# Patient Record
Sex: Female | Born: 1955 | Race: Black or African American | Hispanic: No | Marital: Single | Smoking: Never smoker
Health system: Southern US, Community
[De-identification: ages and names within clinical notes are randomized; demographics above are authoritative.]

## PROBLEM LIST (undated history)

## (undated) DIAGNOSIS — F419 Anxiety disorder, unspecified: Secondary | ICD-10-CM

## (undated) DIAGNOSIS — R87629 Unspecified abnormal cytological findings in specimens from vagina: Secondary | ICD-10-CM

## (undated) DIAGNOSIS — G43909 Migraine, unspecified, not intractable, without status migrainosus: Secondary | ICD-10-CM

## (undated) DIAGNOSIS — M1990 Unspecified osteoarthritis, unspecified site: Secondary | ICD-10-CM

## (undated) DIAGNOSIS — K2970 Gastritis, unspecified, without bleeding: Secondary | ICD-10-CM

## (undated) DIAGNOSIS — F32A Depression, unspecified: Secondary | ICD-10-CM

## (undated) DIAGNOSIS — C801 Malignant (primary) neoplasm, unspecified: Secondary | ICD-10-CM

## (undated) DIAGNOSIS — I10 Essential (primary) hypertension: Secondary | ICD-10-CM

## (undated) DIAGNOSIS — N289 Disorder of kidney and ureter, unspecified: Secondary | ICD-10-CM

## (undated) HISTORY — DX: Unspecified osteoarthritis, unspecified site: M19.90

## (undated) HISTORY — PX: CHOLECYSTECTOMY: SHX55

## (undated) HISTORY — DX: Essential (primary) hypertension: I10

## (undated) HISTORY — DX: Gastritis, unspecified, without bleeding: K29.70

## (undated) HISTORY — PX: TUBAL LIGATION: SHX77

## (undated) HISTORY — PX: NEPHRECTOMY: SHX65

## (undated) HISTORY — DX: Malignant (primary) neoplasm, unspecified: C80.1

## (undated) HISTORY — DX: Anxiety disorder, unspecified: F41.9

## (undated) HISTORY — DX: Unspecified abnormal cytological findings in specimens from vagina: R87.629

## (undated) HISTORY — DX: Depression, unspecified: F32.A

---

## 2008-01-01 ENCOUNTER — Emergency Department (HOSPITAL_COMMUNITY): Admission: EM | Admit: 2008-01-01 | Discharge: 2008-01-01 | Payer: Self-pay | Admitting: Emergency Medicine

## 2008-01-29 ENCOUNTER — Emergency Department (HOSPITAL_COMMUNITY): Admission: EM | Admit: 2008-01-29 | Discharge: 2008-01-29 | Payer: Self-pay | Admitting: Emergency Medicine

## 2008-07-18 IMAGING — CR DG CHEST 2V
2 series · 2 of 2 positions shown · non-contrast
Comparison: No priors for comparison.

CLINICAL DATA: Right-sided chest pain. Shortness of breath.  Dizziness. 
 CHEST ? 2 VIEW:

[w chest pa]
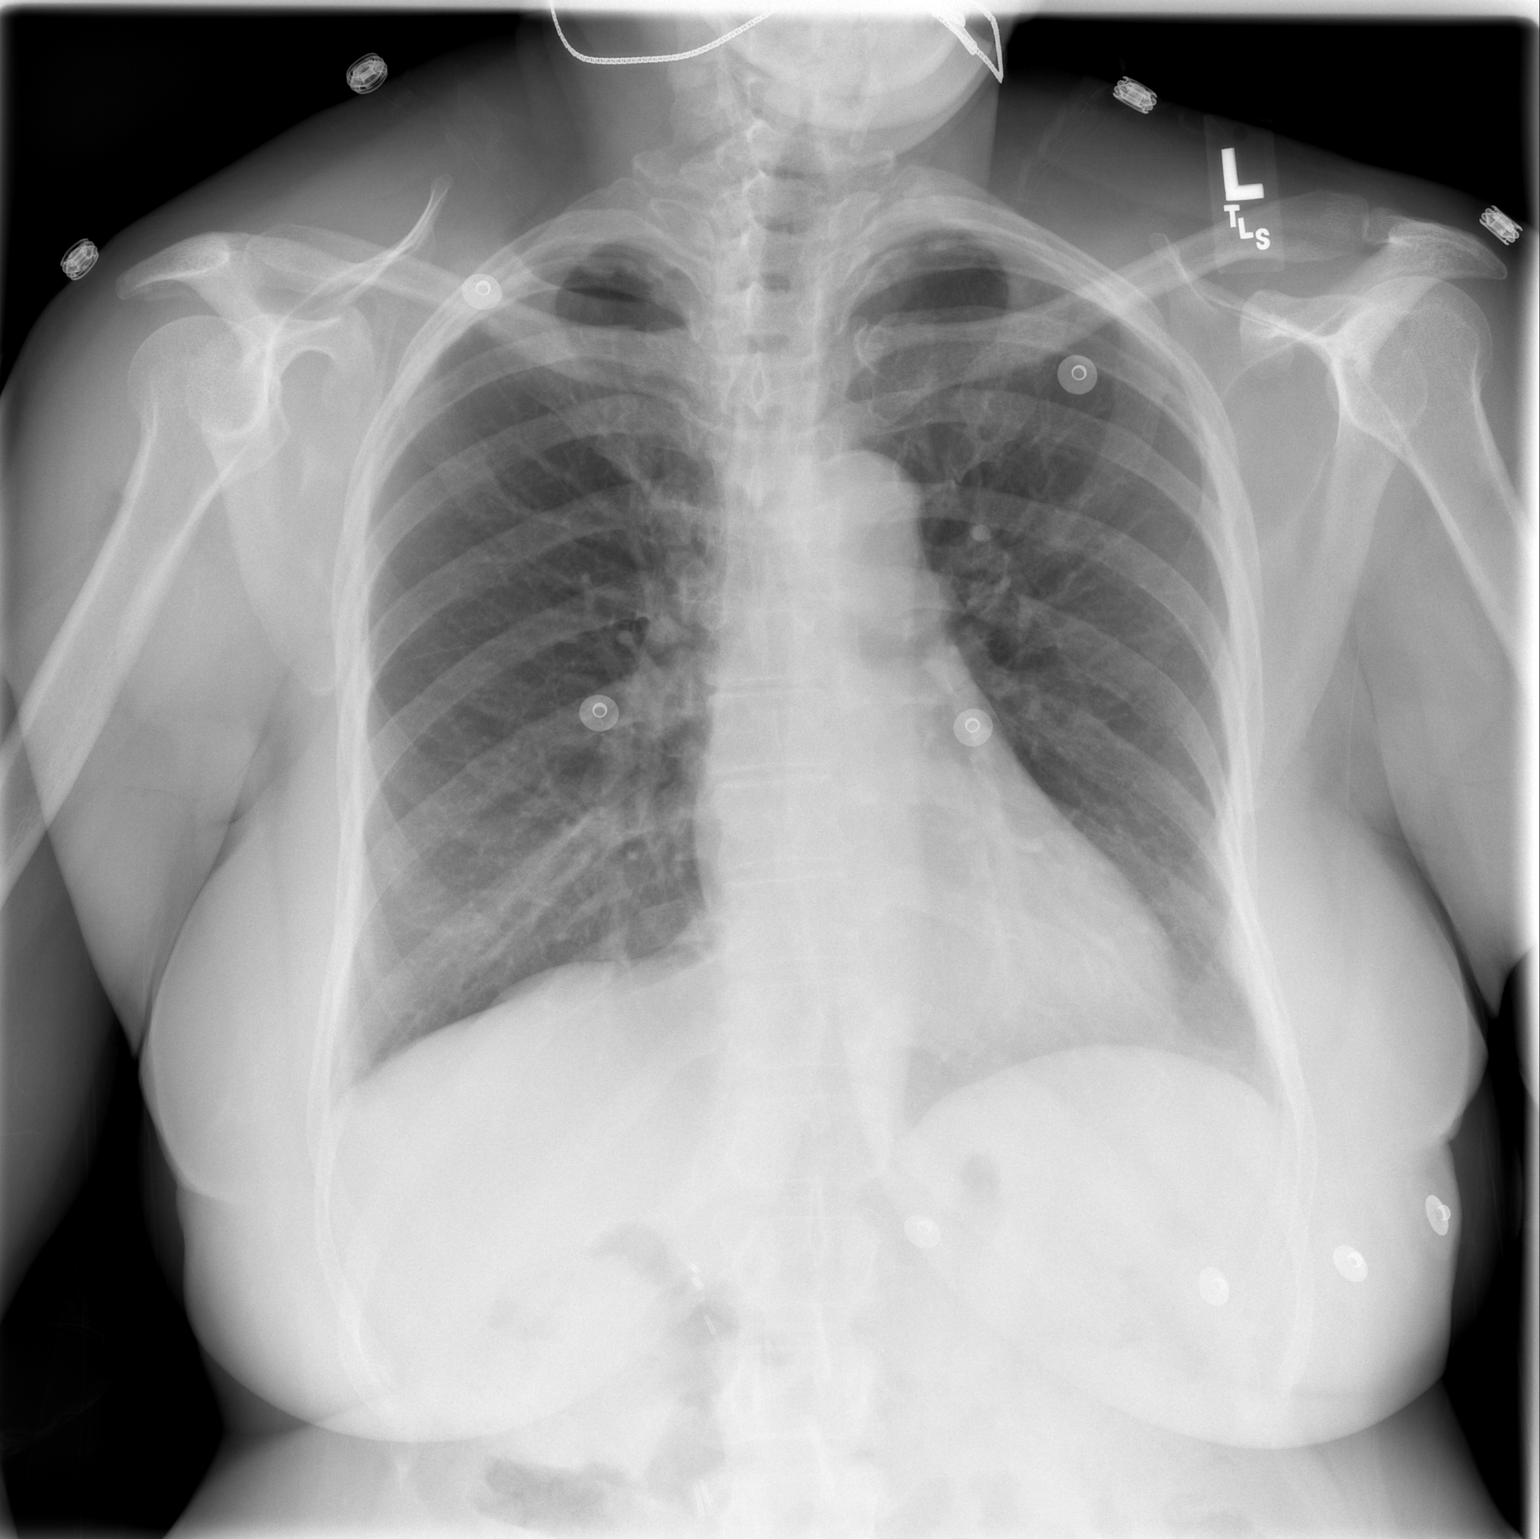

[w chest lat]
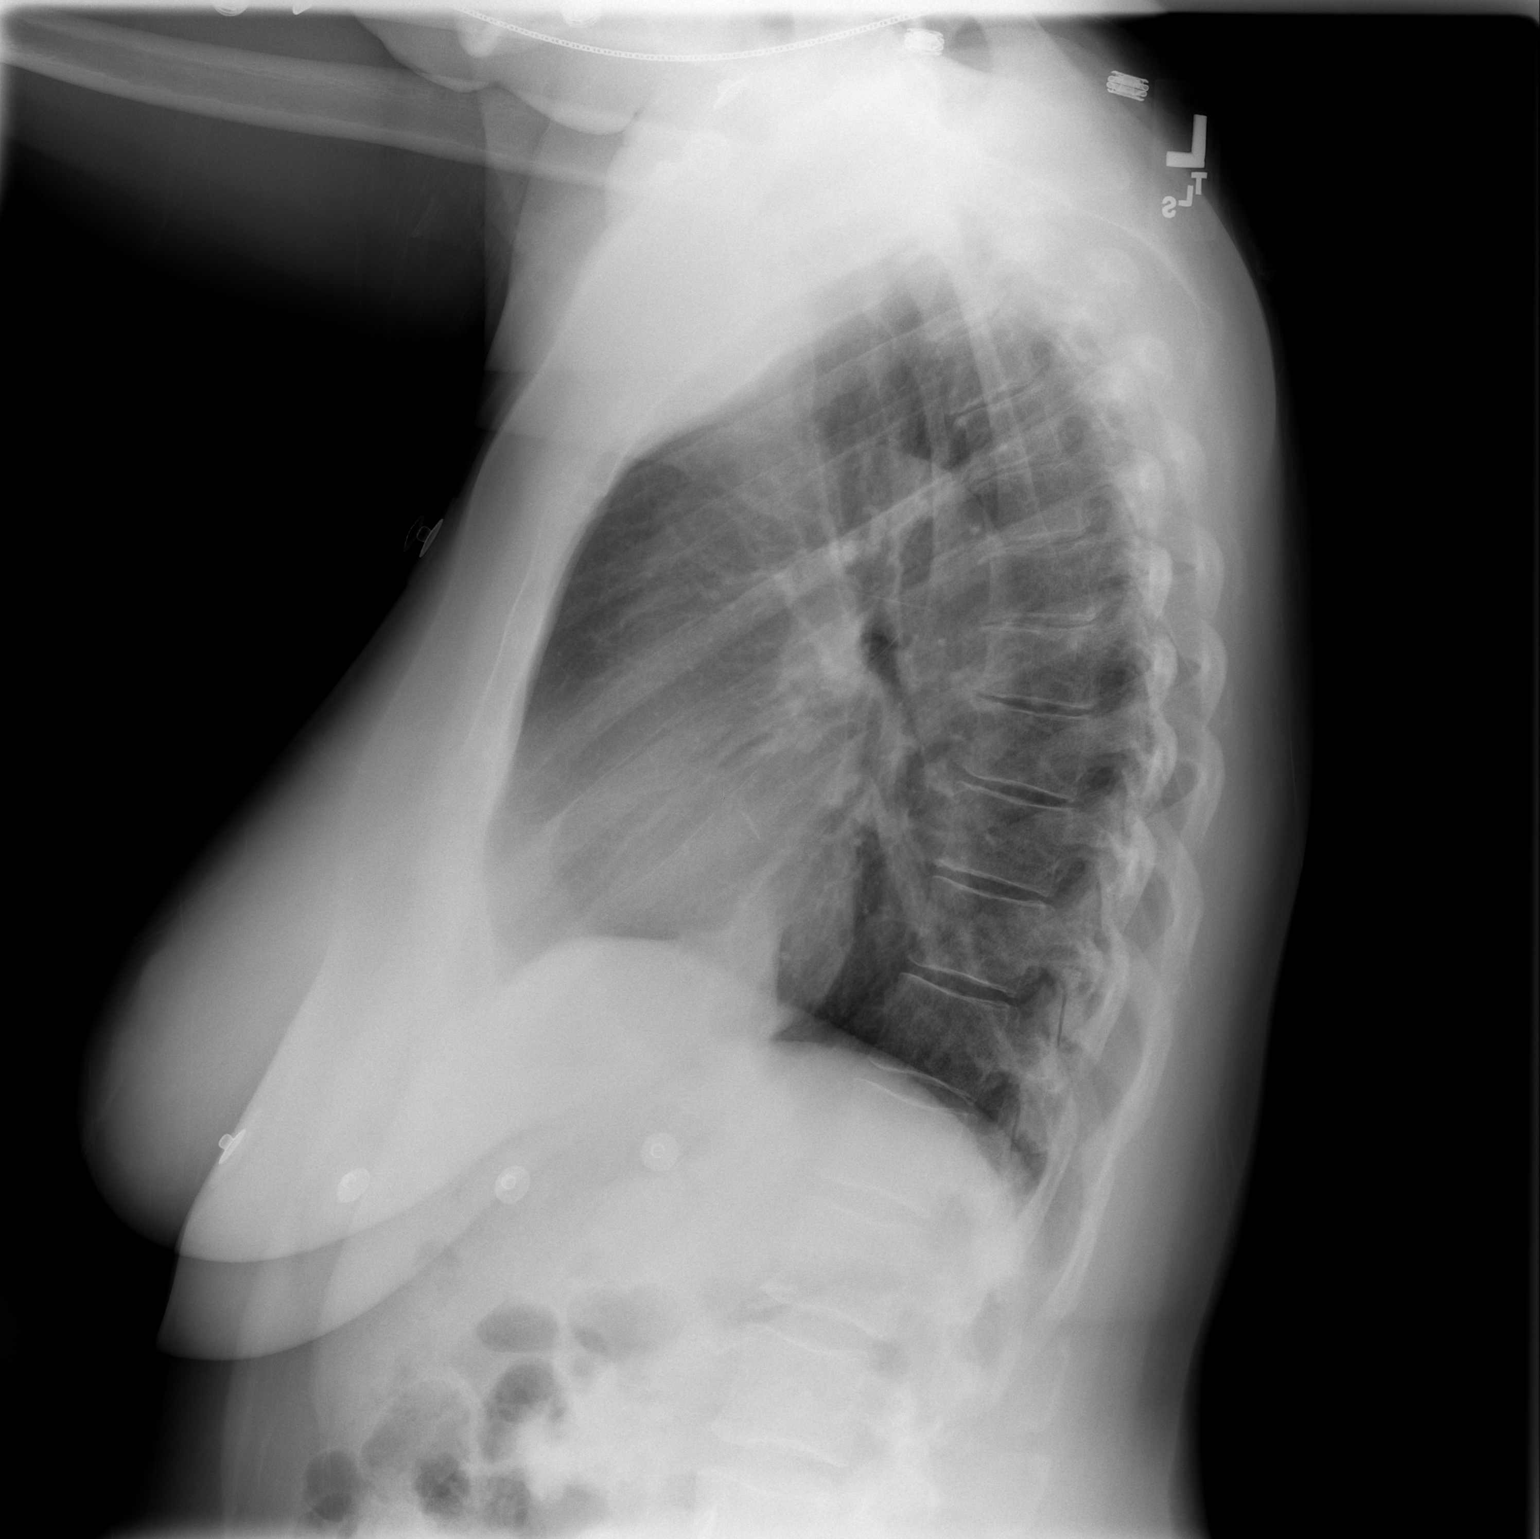

[2 of 2 positions shown; findings below may reference images not displayed]

FINDINGS: The heart size and mediastinal contours are within normal limits.  Both lungs are clear.  The visualized skeletal structures are unremarkable.
IMPRESSION: No active cardiopulmonary disease.

## 2011-08-06 LAB — I-STAT 8, (EC8 V) (CONVERTED LAB)
Acid-Base Excess: 1
Chloride: 108
Glucose, Bld: 76
Operator id: 288831
Potassium: 4.6
Sodium: 140
TCO2: 29
pCO2, Ven: 46.2
pH, Ven: 7.377 — ABNORMAL HIGH

## 2011-08-06 LAB — POCT CARDIAC MARKERS
CKMB, poc: 1.1
Myoglobin, poc: 52.5
Operator id: 288831
Troponin i, poc: <0.05

## 2011-08-06 LAB — POCT URINALYSIS DIP (DEVICE)
Bilirubin Urine: NEGATIVE
Glucose, UA: NEGATIVE
Nitrite: NEGATIVE
Operator id: 239701
Urobilinogen, UA: 0.2

## 2011-08-06 LAB — DIFFERENTIAL
Eosinophils Absolute: 0.1
Lymphocytes Relative: 43
Lymphs Abs: 2
Neutro Abs: 2.1
Neutrophils Relative %: 45

## 2011-08-06 LAB — WET PREP, GENITAL
Trich, Wet Prep: NONE SEEN
Yeast Wet Prep HPF POC: NONE SEEN

## 2011-08-06 LAB — CBC
Platelets: 239
WBC: 4.7

## 2011-08-06 LAB — POCT I-STAT CREATININE: Operator id: 288831

## 2011-08-06 LAB — GC/CHLAMYDIA PROBE AMP, GENITAL: Chlamydia, DNA Probe: NEGATIVE

## 2012-06-06 ENCOUNTER — Other Ambulatory Visit: Payer: Self-pay | Admitting: Family Medicine

## 2012-06-06 DIAGNOSIS — R202 Paresthesia of skin: Secondary | ICD-10-CM

## 2012-06-13 ENCOUNTER — Encounter (INDEPENDENT_AMBULATORY_CARE_PROVIDER_SITE_OTHER): Payer: Self-pay | Admitting: Ophthalmology

## 2021-04-03 ENCOUNTER — Ambulatory Visit (HOSPITAL_COMMUNITY)
Admission: EM | Admit: 2021-04-03 | Discharge: 2021-04-03 | Disposition: A | Discharge status: Home / Self Care | Payer: Medicare Other | Attending: Internal Medicine | Admitting: Internal Medicine

## 2021-04-03 ENCOUNTER — Encounter (HOSPITAL_COMMUNITY): Payer: Self-pay

## 2021-04-03 DIAGNOSIS — M5010 Cervical disc disorder with radiculopathy, unspecified cervical region: Secondary | ICD-10-CM | POA: Diagnosis not present

## 2021-04-03 DIAGNOSIS — M5431 Sciatica, right side: Secondary | ICD-10-CM

## 2021-04-03 HISTORY — DX: Disorder of kidney and ureter, unspecified: N28.9

## 2021-04-03 HISTORY — DX: Migraine, unspecified, not intractable, without status migrainosus: G43.909

## 2021-04-03 MED ORDER — PREDNISONE 20 MG PO TABS: 40.0000 mg | ORAL_TABLET | Freq: Every day | ORAL | 0 refills | Status: AC

## 2021-04-03 MED ORDER — DEXAMETHASONE SODIUM PHOSPHATE 10 MG/ML IJ SOLN
10.0000 mg | Freq: Once | INTRAMUSCULAR | Status: AC
Start: 2021-04-03 — End: 2021-04-03
  Administered 2021-04-03: 10 mg via INTRAMUSCULAR

## 2021-04-03 MED ORDER — DEXAMETHASONE 10 MG/ML FOR PEDIATRIC ORAL USE
INTRAMUSCULAR | Status: AC
  Filled 2021-04-03: qty 1

## 2021-04-03 MED ORDER — DEXAMETHASONE SODIUM PHOSPHATE 10 MG/ML IJ SOLN
INTRAMUSCULAR | Status: AC
  Filled 2021-04-03: qty 1

## 2021-04-03 NOTE — Discharge Instructions (Signed)
The range of motion exercises Take medications as directed Heating pad will help with symptoms Return to urgent care to be reevaluated.

## 2021-04-03 NOTE — ED Triage Notes (Signed)
Pt reports left side chest pain, left shoulder pain, numbness in the left hand x 2 weeks; weakness sx 1 week, right hip pain x 4 days. Denies dizziness, vision changes, jaw pain, nausea, vomiting. Naproxen, methocarbamol gives no relief, last dose 2 days ago.

## 2021-04-03 NOTE — ED Provider Notes (Signed)
[facility]    CSN: [id number] Arrival date & time: 04/03/21  1237      History   Chief Complaint Chief Complaint  Patient presents with  . Weakness  . Chest Pain  . Arm Pain  . Hip Pain    HPI Kylie Hudson is a 65 y.o. female comes to the urgent care with left-sided back pain as well as left shoulder pain radiating into the left arm.  Symptoms started 2 weeks ago after she drove from [place] to [place].   No weakness in the left hand.  Patient is not dropping objects.  She complains of right hip pain which radiates down the right leg.  No weakness in the right leg.  No known relieving factors.  Pain is currently 10 out of 10.  No falls or trauma.  No calf swelling or pain.  HPI  Past Medical History:  Diagnosis Date  . Kidney disease   . Migraine     There are no problems to display for this patient.   Past Surgical History:  Procedure Laterality Date  . CESAREAN SECTION    . CHOLECYSTECTOMY    . NEPHRECTOMY     Pt can not remmeebr which kidney was removed.     OB History    Gravida  2   Para      Term      Preterm      AB  0   Living  2     SAB      IAB      Ectopic      Multiple      Live Births               Home Medications    Prior to Admission medications   Medication Sig Start Date End Date Taking? Authorizing Provider  metoprolol succinate (TOPROL-XL) 100 MG 24 hr tablet  06/27/20  Yes [provider]  NIFEdipine (PROCARDIA-XL/NIFEDICAL-XL) 30 MG 24 hr tablet  07/07/20  Yes [provider]  predniSONE (DELTASONE) 20 MG tablet Take 2 tablets (40 mg total) by mouth daily for 5 days. 04/03/21 04/08/21 Yes Burnice Vassel, Britta Mccreedy, MD  benzonatate (TESSALON) 200 MG capsule Take 200 mg by mouth 3 (three) times daily as needed. 02/15/21   [provider]  losartan (COZAAR) 50 MG tablet Take 1 tablet by mouth daily. 02/21/21   [provider]  methocarbamol (ROBAXIN) 750 MG tablet SMARTSIG:1  Tablet(s) By Mouth Every 12 Hours PRN 03/21/21   [provider]  naproxen (EC NAPROSYN) 500 MG EC tablet SMARTSIG:1 Tablet(s) By Mouth Every 12 Hours PRN 03/21/21   [provider]    Family History History reviewed. No pertinent family history.  Social History Social History   Tobacco Use  . Smoking status: Never Smoker  . Smokeless tobacco: Never Used  Substance Use Topics  . Alcohol use: Never  . Drug use: Never     Allergies   Doxycycline and Ibuprofen   Review of Systems Review of Systems  Constitutional: Negative.   HENT: Negative.   Respiratory: Negative.   Musculoskeletal: Positive for arthralgias, back pain and neck pain. Negative for joint swelling, myalgias and neck stiffness.  Neurological: Negative.  Negative for headaches.     Physical Exam Triage Vital Signs ED Triage Vitals  Enc Vitals Group     BP 04/03/21 1329 114/72     Pulse Rate 04/03/21 1329 69     Resp 04/03/21 1329 18  Temp 04/03/21 1329 98 F (36.7 C)     Temp Source 04/03/21 1329 Oral     SpO2 04/03/21 1329 100 %     Weight --      Height --      Head Circumference --      Peak Flow --      Pain Score 04/03/21 1326 10     Pain Loc --      Pain Edu? --      Excl. in GC? --    No data found.  Updated Vital Signs BP 114/72 (BP Location: Right Arm)   Pulse 69   Temp 98 F (36.7 C) (Oral)   Resp 18   SpO2 100%   Visual Acuity Right Eye Distance:   Left Eye Distance:   Bilateral Distance:    Right Eye Near:   Left Eye Near:    Bilateral Near:     Physical Exam Vitals and nursing note reviewed.  Constitutional:      General: She is in acute distress.     Appearance: She is not ill-appearing.  Cardiovascular:     Heart sounds: Normal heart sounds.  Pulmonary:     Effort: Pulmonary effort is normal.     Breath sounds: Normal breath sounds.  Abdominal:     Palpations: Abdomen is soft.  Musculoskeletal:        General: Normal range of motion.      Cervical back: Normal range of motion and neck supple.     Comments: Bicep tendon reflexes are 2+.  Power is 5/5 in both upper extremities.  Tenderness on palpation of the right paraspinal muscles in the lumbosacral region.  Deep tendon reflexes of the patellar tendons 2+.  No calf tenderness.  Neurological:     Mental Status: She is alert.      UC Treatments / Results  Labs (all labs ordered are listed, but only abnormal results are displayed) Labs Reviewed - No data to display  EKG   Radiology No results found.  Procedures Procedures (including critical care time)  Medications Ordered in UC Medications  dexamethasone (DECADRON) injection 10 mg (10 mg Intramuscular Given 04/03/21 1425)    Initial Impression / Assessment and Plan / UC Course  I have reviewed the triage vital signs and the nursing notes.  Pertinent labs & imaging results that were available during my care of the patient were reviewed by me and considered in my medical decision making (see chart for details).     1.  Cervical disc disease with radiculopathy: Patient has tried NSAIDs with no improvement in symptoms Short course of steroids will be tried If symptoms persist and patient is going to stay in clinic she may benefit from orthopedic surgery evaluation Otherwise if patient returns to [place] she may follow-up with the orthopedics clinic as directed.  2.  Right-sided sciatica: Management as above Return precautions given Final Clinical Impressions(s) / UC Diagnoses   Final diagnoses:  Cervical disc disorder with radiculopathy of cervical region  Right sided sciatica     Discharge Instructions     The range of motion exercises Take medications as directed Heating pad will help with symptoms Return to urgent care to be reevaluated.   ED Prescriptions    Medication Sig Dispense Auth. Provider   predniSONE (DELTASONE) 20 MG tablet Take 2 tablets (40 mg total) by mouth daily for 5 days. 10  tablet Teliyah Royal, Britta Mccreedy, MD     PDMP not reviewed  this encounter.   Merrilee Jansky, MD 04/04/21 2117

## 2023-06-23 ENCOUNTER — Other Ambulatory Visit: Payer: Self-pay

## 2023-06-23 ENCOUNTER — Emergency Department (HOSPITAL_COMMUNITY)
Admission: EM | Admit: 2023-06-23 | Discharge: 2023-06-24 | Disposition: A | Discharge status: Home / Self Care | Payer: Medicare Other | Attending: Emergency Medicine | Admitting: Emergency Medicine

## 2023-06-23 ENCOUNTER — Encounter (HOSPITAL_COMMUNITY): Payer: Self-pay

## 2023-06-23 DIAGNOSIS — R197 Diarrhea, unspecified: Secondary | ICD-10-CM | POA: Diagnosis not present

## 2023-06-23 DIAGNOSIS — R1084 Generalized abdominal pain: Secondary | ICD-10-CM

## 2023-06-23 DIAGNOSIS — J069 Acute upper respiratory infection, unspecified: Secondary | ICD-10-CM

## 2023-06-23 DIAGNOSIS — R112 Nausea with vomiting, unspecified: Secondary | ICD-10-CM | POA: Diagnosis not present

## 2023-06-23 DIAGNOSIS — Z1152 Encounter for screening for COVID-19: Secondary | ICD-10-CM | POA: Insufficient documentation

## 2023-06-23 DIAGNOSIS — Z79899 Other long term (current) drug therapy: Secondary | ICD-10-CM | POA: Insufficient documentation

## 2023-06-23 LAB — URINALYSIS, ROUTINE W REFLEX MICROSCOPIC
Bilirubin Urine: NEGATIVE
Glucose, UA: NEGATIVE mg/dL
Hgb urine dipstick: NEGATIVE
Ketones, ur: NEGATIVE mg/dL
Nitrite: NEGATIVE
Protein, ur: 30 mg/dL — AB
Specific Gravity, Urine: 1.03 (ref 1.005–1.030)
pH: 5 (ref 5.0–8.0)

## 2023-06-23 LAB — COMPREHENSIVE METABOLIC PANEL
ALT: 14 U/L (ref 0–44)
AST: 14 U/L — ABNORMAL LOW (ref 15–41)
Albumin: 3.8 g/dL (ref 3.5–5.0)
Alkaline Phosphatase: 90 U/L (ref 38–126)
Anion gap: 11 (ref 5–15)
BUN: 10 mg/dL (ref 8–23)
CO2: 24 mmol/L (ref 22–32)
Calcium: 9.4 mg/dL (ref 8.9–10.3)
Chloride: 105 mmol/L (ref 98–111)
Creatinine, Ser: 1.02 mg/dL — ABNORMAL HIGH (ref 0.44–1.00)
GFR, Estimated: 60 mL/min — ABNORMAL LOW (ref >60)
Glucose, Bld: 98 mg/dL (ref 70–99)
Potassium: 3.6 mmol/L (ref 3.5–5.1)
Sodium: 140 mmol/L (ref 135–145)
Total Bilirubin: 0.5 mg/dL (ref 0.3–1.2)
Total Protein: 6.8 g/dL (ref 6.5–8.1)

## 2023-06-23 LAB — RESP PANEL BY RT-PCR (RSV, FLU A&B, COVID)  RVPGX2
Influenza A by PCR: NEGATIVE
Influenza B by PCR: NEGATIVE
Resp Syncytial Virus by PCR: NEGATIVE
SARS Coronavirus 2 by RT PCR: NEGATIVE

## 2023-06-23 LAB — CBC
HCT: 40.6 % (ref 36.0–46.0)
Hemoglobin: 13 g/dL (ref 12.0–15.0)
MCH: 27.1 pg (ref 26.0–34.0)
MCHC: 32 g/dL (ref 30.0–36.0)
MCV: 84.6 fL (ref 80.0–100.0)
Platelets: 252 10*3/uL (ref 150–400)
RBC: 4.8 MIL/uL (ref 3.87–5.11)
RDW: 13.5 % (ref 11.5–15.5)
WBC: 4.2 10*3/uL (ref 4.0–10.5)
nRBC: 0 % (ref 0.0–0.2)

## 2023-06-23 LAB — LIPASE, BLOOD: Lipase: 33 U/L (ref 11–51)

## 2023-06-23 NOTE — ED Triage Notes (Signed)
Patient BIB POV with request for covid test.   Reports taking test 6 days ago and tested negative. Since testing reports having diarrhea, nausea, vomiting, headache and body aches.

## 2023-06-24 ENCOUNTER — Emergency Department (HOSPITAL_COMMUNITY): Payer: Medicare Other

## 2023-06-24 DIAGNOSIS — R1084 Generalized abdominal pain: Secondary | ICD-10-CM | POA: Diagnosis not present

## 2023-06-24 MED ORDER — ONDANSETRON 4 MG PO TBDP: 4.0000 mg | ORAL_TABLET | Freq: Three times a day (TID) | ORAL | 0 refills | Status: DC | PRN

## 2023-06-24 MED ORDER — SODIUM CHLORIDE 0.9 % IV BOLUS
1000.0000 mL | Freq: Once | INTRAVENOUS | Status: AC
  Administered 2023-06-24: 1000 mL via INTRAVENOUS

## 2023-06-24 MED ORDER — ONDANSETRON HCL 4 MG/2ML IJ SOLN
4.0000 mg | Freq: Once | INTRAMUSCULAR | Status: AC
  Administered 2023-06-24: 4 mg via INTRAVENOUS
  Filled 2023-06-24: qty 2

## 2023-06-24 NOTE — ED Provider Notes (Signed)
[place] EMERGENCY DEPARTMENT AT [facility] Provider Note   CSN: [id number] Arrival date & time: 06/23/23  2010     History  Chief Complaint  Patient presents with   Diarrhea   Emesis   Generalized Body Aches    Kylie Hudson is a 66 y.o. female.  5 old female with past medical history of renal cancer treated with nephrectomy, migraines, kidney disease presents with concern for viral illness.  Patient states a week ago she developed congestion, mild cough with vomiting and diarrhea and bodyaches.  Felt like she was feverish at that time.  Had been exposed to her yard keeper who had tested positive for COVID, she was tested negative.  Patient folic she had some viral illness and has stayed home however her symptoms have progressed, she is not feeling any better.  Has not felt feverish since onset of symptoms.  Reports decreased urine output today which she relates to her vomiting and diarrhea.  No recent travel, no recent antibiotics.  Unsure if she is having abdominal pain as she does has cramping with her vomiting and diarrhea.  Prior abdominal surgeries include cholecystectomy, nephrectomy, C-section.       Home Medications Prior to Admission medications   Medication Sig Start Date End Date Taking? Authorizing Provider  ondansetron (ZOFRAN-ODT) 4 MG disintegrating tablet Take 1 tablet (4 mg total) by mouth every 8 (eight) hours as needed for nausea or vomiting. 06/24/23  Yes Jeannie Fend, PA-C  benzonatate (TESSALON) 200 MG capsule Take 200 mg by mouth 3 (three) times daily as needed. 02/15/21   [provider]  losartan (COZAAR) 50 MG tablet Take 1 tablet by mouth daily. 02/21/21   [provider]  methocarbamol (ROBAXIN) 750 MG tablet SMARTSIG:1 Tablet(s) By Mouth Every 12 Hours PRN 03/21/21   [provider]  metoprolol succinate (TOPROL-XL) 100 MG 24 hr tablet  06/27/20   [provider]  naproxen (EC NAPROSYN) 500 MG EC tablet  SMARTSIG:1 Tablet(s) By Mouth Every 12 Hours PRN 03/21/21   [provider]  NIFEdipine (PROCARDIA-XL/NIFEDICAL-XL) 30 MG 24 hr tablet  07/07/20   [provider]      Allergies    Doxycycline and Ibuprofen    Review of Systems   Review of Systems Negative except as per HPI Physical Exam Updated Vital Signs BP 132/77 (BP Location: Right Arm)   Pulse 76   Temp 97.6 F (36.4 C) (Oral)   Resp 18   Ht 5\' 6"  (1.676 m)   Wt 91.6 kg   SpO2 100%   BMI 32.60 kg/m  Physical Exam Vitals and nursing note reviewed.  Constitutional:      General: She is not in acute distress.    Appearance: She is well-developed. She is not diaphoretic.  HENT:     Head: Normocephalic and atraumatic.  Cardiovascular:     Rate and Rhythm: Normal rate and regular rhythm.     Heart sounds: Normal heart sounds.  Pulmonary:     Effort: Pulmonary effort is normal.     Breath sounds: Normal breath sounds.  Abdominal:     Palpations: Abdomen is soft.     Tenderness: There is generalized abdominal tenderness. There is no guarding or rebound.  Musculoskeletal:     Right lower leg: No edema.     Left lower leg: No edema.  Skin:    General: Skin is warm and dry.     Findings: No erythema or rash.  Neurological:  Mental Status: She is alert and oriented to person, place, and time.  Psychiatric:        Behavior: Behavior normal.     ED Results / Procedures / Treatments   Labs (all labs ordered are listed, but only abnormal results are displayed) Labs Reviewed  COMPREHENSIVE METABOLIC PANEL - Abnormal; Notable for the following components:      Result Value   Creatinine, Ser 1.02 (*)    AST 14 (*)    GFR, Estimated 60 (*)    All other components within normal limits  URINALYSIS, ROUTINE W REFLEX MICROSCOPIC - Abnormal; Notable for the following components:   Color, Urine AMBER (*)    APPearance HAZY (*)    Protein, ur 30 (*)    Leukocytes,Ua SMALL (*)    Bacteria, UA FEW (*)     All other components within normal limits  RESP PANEL BY RT-PCR (RSV, FLU A&B, COVID)  RVPGX2  LIPASE, BLOOD  CBC    EKG EKG Interpretation Date/Time:  Sunday June 23 2023 20:45:05 EDT Ventricular Rate:  73 PR Interval:  152 QRS Duration:  66 QT Interval:  378 QTC Calculation: 416 R Axis:   61  Text Interpretation: Normal sinus rhythm Normal ECG When compared with ECG of 03-Apr-2021 13:41, No significant change was found Confirmed by Dione Booze ([id number]) on 06/24/2023 2:44:28 AM  Radiology DG Chest 2 View  Result Date: 06/24/2023 CLINICAL DATA:  Cough x1 week. EXAM: CHEST - 2 VIEW COMPARISON:  PA and lateral 01/01/2008. FINDINGS: The lungs are clear. No pleural effusion is seen. The cardiomediastinal silhouette and vascular pattern are normal. Scattered aortic plaque in the arch is noted. There is mild osteopenia, mild thoracic kyphosis, and multilevel degenerative changes. Interval new finding of a anterior plate fusion hardware lower cervical spine. Old cholecystectomy clips spread IMPRESSION: No evidence of acute chest disease.  Aortic atherosclerosis. Electronically Signed   By: Almira Bar M.D.   On: 06/24/2023 02:08   CT ABDOMEN PELVIS WO CONTRAST  Result Date: 06/24/2023 CLINICAL DATA:  Acute abdominal pain EXAM: CT ABDOMEN AND PELVIS WITHOUT CONTRAST TECHNIQUE: Multidetector CT imaging of the abdomen and pelvis was performed following the standard protocol without IV contrast. RADIATION DOSE REDUCTION: This exam was performed according to the departmental dose-optimization program which includes automated exposure control, adjustment of the mA and/or kV according to patient size and/or use of iterative reconstruction technique. COMPARISON:  None Available. FINDINGS: Lower chest: No acute abnormality. Hepatobiliary: No focal liver abnormality is seen. Status post cholecystectomy. No biliary dilatation. Pancreas: Unremarkable. No pancreatic ductal dilatation or surrounding  inflammatory changes. Spleen: Normal in size without focal abnormality. Adrenals/Urinary Tract: Adrenal glands are within normal limits. Patient has had a prior right nephrectomy. The left kidney is within normal limits. No obstructive changes are seen. The bladder is decompressed. Stomach/Bowel: Scattered diverticular change of the colon is noted. No findings to suggest diverticulitis are seen. The stomach is decompressed. The appendix is within normal limits. Small bowel is unremarkable. Vascular/Lymphatic: No significant vascular findings are present. No enlarged abdominal or pelvic lymph nodes. Reproductive: Uterus and bilateral adnexa are unremarkable. Other: No abdominal wall hernia or abnormality. No abdominopelvic ascites. Musculoskeletal: No acute or significant osseous findings. IMPRESSION: Status post right nephrectomy. Diverticulosis without diverticulitis. No other focal abnormality is noted. Electronically Signed   By: Alcide Clever M.D.   On: 06/24/2023 01:30    Procedures Procedures    Medications Ordered in ED Medications  sodium chloride 0.9 %  bolus 1,000 mL (0 mLs Intravenous Stopped 06/24/23 0404)  ondansetron (ZOFRAN) injection 4 mg (4 mg Intravenous Given 06/24/23 0114)    ED Course/ Medical Decision Making/ A&P                                 Medical Decision Making Amount and/or Complexity of Data Reviewed Labs: ordered. Radiology: ordered.  Risk Prescription drug management.   This patient presents to the ED for concern of URI symptoms with n/v/d, body aches, this involves an extensive number of treatment options, and is a complaint that carries with it a high risk of complications and morbidity.  The differential diagnosis includes but not limited to COVID, PNA, diverticulitis, obstruction    Co morbidities that complicate the patient evaluation  Status post nephrectomy for renal cancer.  CKD.  Migraine.   Additional history obtained:  External records from  outside source obtained and reviewed including prior labs on file for comparison   Lab Tests:  I Ordered, and personally interpreted labs.  The pertinent results include: COVID, flu, RSV negative.  Lipase normal.  CBC within the limits.  CMP without significant findings.  Urinalysis with protein, small leukocytes, contaminated without urinary symptoms   Imaging Studies ordered:  I ordered imaging studies including CT abdomen pelvis, chest x-ray I independently visualized and interpreted imaging which showed no acute findings I agree with the radiologist interpretation   Cardiac Monitoring: / EKG:  The patient was maintained on a cardiac monitor.  I personally viewed and interpreted the cardiac monitored which showed an underlying rhythm of: Normal sinus rhythm, rate 73   Problem List / ED Course / Critical interventions / Medication management  67 year old female presents with concern for URI symptoms as well as nausea, vomiting, diarrhea.  Found to have generalized abdominal discomfort.  Workup is largely reassuring, labs unremarkable, CT abdomen pelvis normal, chest x-ray normal.  Patient feels better after Zofran and IV fluids.  Will discharge with prescription for Zofran.  Recommend recheck with PCP, return if needed. I ordered medication including IV fluids, Zofran for nausea, hydration Reevaluation of the patient after these medicines showed that the patient improved I have reviewed the patients home medicines and have made adjustments as needed   Social Determinants of Health:  Has PCP   Test / Admission - Considered:  Stable for discharge with plan to recheck with PCP         Final Clinical Impression(s) / ED Diagnoses Final diagnoses:  Nausea vomiting and diarrhea  Generalized abdominal pain  Viral URI with cough    Rx / DC Orders ED Discharge Orders          Ordered    ondansetron (ZOFRAN-ODT) 4 MG disintegrating tablet  Every 8 hours PRN         06/24/23 0236              Jeannie Fend, PA-C 06/24/23 0540    Mesner, Barbara Cower, MD 06/24/23 2304

## 2023-06-24 NOTE — Discharge Instructions (Signed)
Zofran as needed as prescribed for nausea and vomiting. Imodium as needed as directed for diarrhea. Recheck with your PCP this week. Return to the ER for new or worsening symptoms.

## 2023-08-12 DIAGNOSIS — F411 Generalized anxiety disorder: Secondary | ICD-10-CM | POA: Diagnosis not present

## 2023-08-12 DIAGNOSIS — F329 Major depressive disorder, single episode, unspecified: Secondary | ICD-10-CM | POA: Diagnosis not present

## 2023-08-24 ENCOUNTER — Encounter (HOSPITAL_COMMUNITY): Payer: Self-pay

## 2023-08-24 ENCOUNTER — Emergency Department (HOSPITAL_COMMUNITY)
Admission: EM | Admit: 2023-08-24 | Discharge: 2023-08-24 | Disposition: A | Discharge status: Home / Self Care | Payer: Medicare HMO | Attending: Emergency Medicine | Admitting: Emergency Medicine

## 2023-08-24 ENCOUNTER — Other Ambulatory Visit: Payer: Self-pay

## 2023-08-24 DIAGNOSIS — Z79899 Other long term (current) drug therapy: Secondary | ICD-10-CM | POA: Insufficient documentation

## 2023-08-24 DIAGNOSIS — R202 Paresthesia of skin: Secondary | ICD-10-CM | POA: Diagnosis not present

## 2023-08-24 DIAGNOSIS — M542 Cervicalgia: Secondary | ICD-10-CM | POA: Diagnosis not present

## 2023-08-24 MED ORDER — METHOCARBAMOL 500 MG PO TABS
750.0000 mg | ORAL_TABLET | Freq: Once | ORAL | Status: AC
  Administered 2023-08-24: 750 mg via ORAL
  Filled 2023-08-24: qty 2

## 2023-08-24 MED ORDER — METHOCARBAMOL 750 MG PO TABS: 750.0000 mg | ORAL_TABLET | Freq: Two times a day (BID) | ORAL | 0 refills | Status: DC

## 2023-08-24 NOTE — Discharge Instructions (Signed)
Take Robaxin twice daily. You can add Tylenol to this for additional relief. Warm compresses may help with pain as well.   Follow up with your doctor at the [facility] for recheck next week.

## 2023-08-24 NOTE — ED Provider Notes (Signed)
[place] EMERGENCY DEPARTMENT AT [facility] Provider Note   CSN: [id number] Arrival date & time: 08/24/23  4098     History  Chief Complaint  Patient presents with   Migraine    Kylie Hudson is a 67 y.o. female.  Patient to ED with pain in the left occipital scalp/left upper neck. She reports the pain has been progressive over the last 3 months. It is worse when she wakes up in the mornings. No fever. She has a history of migraine headaches but reports this is not typical for her migraines. She has not had vomiting with the neck pain. She has not had photophobia with the neck pain. She reports some tingling in the left hand without weakness. She has had cervical surgery in the past but no recent imaging studies of her neck.   The history is provided by the patient. No language interpreter was used.  Migraine       Home Medications Prior to Admission medications   Medication Sig Start Date End Date Taking? Authorizing Provider  methocarbamol (ROBAXIN) 750 MG tablet Take 1 tablet (750 mg total) by mouth 2 (two) times daily. 08/24/23  Yes Patrena Santalucia, PA-C  benzonatate (TESSALON) 200 MG capsule Take 200 mg by mouth 3 (three) times daily as needed. 02/15/21   [provider]  losartan (COZAAR) 50 MG tablet Take 1 tablet by mouth daily. 02/21/21   [provider]  metoprolol succinate (TOPROL-XL) 100 MG 24 hr tablet  06/27/20   [provider]  naproxen (EC NAPROSYN) 500 MG EC tablet SMARTSIG:1 Tablet(s) By Mouth Every 12 Hours PRN 03/21/21   [provider]  NIFEdipine (PROCARDIA-XL/NIFEDICAL-XL) 30 MG 24 hr tablet  07/07/20   [provider]  ondansetron (ZOFRAN-ODT) 4 MG disintegrating tablet Take 1 tablet (4 mg total) by mouth every 8 (eight) hours as needed for nausea or vomiting. 06/24/23   Jeannie Fend, PA-C      Allergies    Doxycycline and Ibuprofen    Review of Systems   Review of Systems  Physical  Exam Updated Vital Signs BP (!) 167/92 (BP Location: Right Arm)   Pulse 86   Temp (!) 97.5 F (36.4 C) (Oral)   Resp 16   Ht 5\' 6"  (1.676 m)   Wt 96.6 kg   SpO2 100%   BMI 34.38 kg/m  Physical Exam Vitals and nursing note reviewed.  Constitutional:      Appearance: Normal appearance. She is not ill-appearing.  HENT:     Head: Normocephalic and atraumatic.   Neck:     Comments: No midline cervical tenderness.  Cardiovascular:     Rate and Rhythm: Normal rate.  Pulmonary:     Effort: Pulmonary effort is normal.  Abdominal:     General: There is no distension.     Tenderness: There is no abdominal tenderness.  Musculoskeletal:        General: Normal range of motion.     Cervical back: Normal range of motion and neck supple. No rigidity.  Skin:    General: Skin is warm and dry.  Neurological:     Mental Status: She is alert and oriented to person, place, and time.     GCS: GCS eye subscore is 4. GCS verbal subscore is 5. GCS motor subscore is 6.     Cranial Nerves: Cranial nerves 2-12 are intact. No cranial nerve deficit.     Sensory: Sensation is intact.     Motor:  Motor function is intact. No weakness, abnormal muscle tone or pronator drift.     Coordination: Coordination is intact. Finger-Nose-Finger Test normal.     ED Results / Procedures / Treatments   Labs (all labs ordered are listed, but only abnormal results are displayed) Labs Reviewed - No data to display No results found for this or any previous visit (from the past 24 hour(s)).  EKG None  Radiology No results found.  Procedures Procedures    Medications Ordered in ED Medications  methocarbamol (ROBAXIN) tablet 750 mg (750 mg Oral Given 08/24/23 1231)    ED Course/ Medical Decision Making/ A&P                                 Medical Decision Making This patient presents to the ED for concern of progressively worsening headache, this involves an extensive number of treatment options, and is  a complaint that carries with it a high risk of complications and morbidity.  The differential diagnosis includes intracranial bleed, mass, CVA, infection, radiculopathy   Co morbidities that complicate the patient evaluation     Additional history obtained:   External records from outside source obtained and reviewed including limited medical records   Lab Tests:  I Ordered, and personally interpreted labs.  The pertinent results include:  No labs indicated    Imaging Studies ordered:  I ordered imaging studies including No imaging studies indicated     Problem List / ED Course / Critical interventions / Medication management  Headache dissimilar to migraine history, located at left base of skull, reproducible, left UE tingling without weakness - most likely cervical radiculopathy.   I ordered medication including Robaxin  for muscular pain  Reevaluation of the patient after these medicines showed that the patient improved I have reviewed the patients home medicines and have made adjustments as needed   Social determinants of health: Has established PCP/[facility]  Test / Admission - Considered:  Pain better with Robaxin Suggest addition of Tylenol, warm compresses, change of pillow Follow up with [facility] for recheck in the outpatient setting.    Risk Prescription drug management.           Final Clinical Impression(s) / ED Diagnoses Final diagnoses:  Musculoskeletal neck pain    Rx / DC Orders ED Discharge Orders          Ordered    methocarbamol (ROBAXIN) 750 MG tablet  2 times daily        08/24/23 1340              Elpidio Anis, PA-C 08/24/23 1342    Margarita Grizzle, MD 08/25/23 1421

## 2023-08-24 NOTE — ED Triage Notes (Signed)
Pt c.o lower posterior migraine that woke her out of her sleep this morning. Photosensitivity and dry heaving as well.

## 2023-10-10 ENCOUNTER — Emergency Department (HOSPITAL_COMMUNITY): Payer: Medicare HMO

## 2023-10-10 ENCOUNTER — Emergency Department (HOSPITAL_COMMUNITY)
Admission: EM | Admit: 2023-10-10 | Discharge: 2023-10-10 | Disposition: A | Discharge status: Home / Self Care | Payer: Medicare HMO | Attending: Emergency Medicine | Admitting: Emergency Medicine

## 2023-10-10 ENCOUNTER — Encounter (HOSPITAL_COMMUNITY): Payer: Self-pay

## 2023-10-10 ENCOUNTER — Other Ambulatory Visit: Payer: Self-pay

## 2023-10-10 DIAGNOSIS — I672 Cerebral atherosclerosis: Secondary | ICD-10-CM | POA: Diagnosis not present

## 2023-10-10 DIAGNOSIS — R519 Headache, unspecified: Secondary | ICD-10-CM | POA: Insufficient documentation

## 2023-10-10 DIAGNOSIS — G4489 Other headache syndrome: Secondary | ICD-10-CM | POA: Diagnosis not present

## 2023-10-10 DIAGNOSIS — I10 Essential (primary) hypertension: Secondary | ICD-10-CM | POA: Diagnosis not present

## 2023-10-10 LAB — BASIC METABOLIC PANEL
Anion gap: 10 (ref 5–15)
BUN: 12 mg/dL (ref 8–23)
CO2: 24 mmol/L (ref 22–32)
Calcium: 9.4 mg/dL (ref 8.9–10.3)
Chloride: 102 mmol/L (ref 98–111)
Creatinine, Ser: 1.03 mg/dL — ABNORMAL HIGH (ref 0.44–1.00)
GFR, Estimated: 59 mL/min — ABNORMAL LOW (ref >60)
Glucose, Bld: 94 mg/dL (ref 70–99)
Potassium: 3.7 mmol/L (ref 3.5–5.1)
Sodium: 136 mmol/L (ref 135–145)

## 2023-10-10 LAB — CBC WITH DIFFERENTIAL/PLATELET
Abs Immature Granulocytes: 0.01 10*3/uL (ref 0.00–0.07)
Basophils Absolute: 0 10*3/uL (ref 0.0–0.1)
Basophils Relative: 1 %
Eosinophils Absolute: 0.1 10*3/uL (ref 0.0–0.5)
Eosinophils Relative: 2 %
HCT: 38.2 % (ref 36.0–46.0)
Hemoglobin: 12.2 g/dL (ref 12.0–15.0)
Immature Granulocytes: 0 %
Lymphocytes Relative: 49 %
Lymphs Abs: 2 10*3/uL (ref 0.7–4.0)
MCH: 27.3 pg (ref 26.0–34.0)
MCHC: 31.9 g/dL (ref 30.0–36.0)
MCV: 85.5 fL (ref 80.0–100.0)
Monocytes Absolute: 0.3 10*3/uL (ref 0.1–1.0)
Monocytes Relative: 8 %
Neutro Abs: 1.6 10*3/uL — ABNORMAL LOW (ref 1.7–7.7)
Neutrophils Relative %: 40 %
Platelets: 216 10*3/uL (ref 150–400)
RBC: 4.47 MIL/uL (ref 3.87–5.11)
RDW: 14.4 % (ref 11.5–15.5)
WBC: 4 10*3/uL (ref 4.0–10.5)
nRBC: 0 % (ref 0.0–0.2)

## 2023-10-10 MED ORDER — ACETAMINOPHEN 325 MG PO TABS
650.0000 mg | ORAL_TABLET | Freq: Once | ORAL | Status: AC
  Administered 2023-10-10: 650 mg via ORAL
  Filled 2023-10-10: qty 2

## 2023-10-10 MED ORDER — MORPHINE SULFATE (PF) 4 MG/ML IV SOLN
4.0000 mg | Freq: Once | INTRAVENOUS | Status: DC
  Filled 2023-10-10: qty 1

## 2023-10-10 MED ORDER — IOHEXOL 350 MG/ML SOLN
75.0000 mL | Freq: Once | INTRAVENOUS | Status: AC | PRN
  Administered 2023-10-10: 75 mL via INTRAVENOUS

## 2023-10-10 MED ORDER — PROCHLORPERAZINE EDISYLATE 10 MG/2ML IJ SOLN
10.0000 mg | Freq: Once | INTRAMUSCULAR | Status: AC
  Administered 2023-10-10: 10 mg via INTRAVENOUS
  Filled 2023-10-10: qty 2

## 2023-10-10 MED ORDER — DIPHENHYDRAMINE HCL 50 MG/ML IJ SOLN
12.5000 mg | Freq: Once | INTRAMUSCULAR | Status: AC
  Administered 2023-10-10: 12.5 mg via INTRAVENOUS
  Filled 2023-10-10: qty 1

## 2023-10-10 NOTE — ED Notes (Signed)
Patient offered morphine, but she reports no current pain. Morphine held.

## 2023-10-10 NOTE — ED Triage Notes (Signed)
Pt BIB EMS from home. EMS reports pt woke at 0530 with headache.  Hx of migraines but says this feels worse  EMS VS 160/80, HR 100, 99% RA

## 2023-10-10 NOTE — Discharge Instructions (Addendum)
It was a pleasure taking care of you today.  As discussed, your labs were reassuring.  Your CT of your head did not show any abnormalities.  Continue to take over-the-counter pain medication as needed for your headache.  Please follow-up with PCP within the next few days for a recheck.  Return to the ER for new or worsening symptoms.

## 2023-10-10 NOTE — ED Provider Notes (Signed)
[place] EMERGENCY DEPARTMENT AT [facility] Provider Note   CSN: [id number] Arrival date & time: 10/10/23  4098     History  Chief Complaint  Patient presents with   Migraine    Kylie Hudson is a 67 y.o. female with a past medical history significant for history of migraines who presents to the ED due to severe posterior headache that woke her from her sleep around 5:30 AM this morning.  Patient states this does not feel similar to her previous migraines.  Notes it is much more severe. Described as "the worst headache of her life". Headache associated with photophobia and nausea.  No vomiting.  No fever or chills.  No recent head injury.  Not on any blood thinners.  Denies neck stiffness.  Denies visual and speech changes.  No unilateral weakness.  She has not taken anything for her headache prior to arrival.  History obtained from patient and past medical records. No interpreter used during encounter.       Home Medications Prior to Admission medications   Medication Sig Start Date End Date Taking? Authorizing Provider  benzonatate (TESSALON) 200 MG capsule Take 200 mg by mouth 3 (three) times daily as needed. 02/15/21   [provider]  losartan (COZAAR) 50 MG tablet Take 1 tablet by mouth daily. 02/21/21   [provider]  methocarbamol (ROBAXIN) 750 MG tablet Take 1 tablet (750 mg total) by mouth 2 (two) times daily. 08/24/23   Elpidio Anis, PA-C  metoprolol succinate (TOPROL-XL) 100 MG 24 hr tablet  06/27/20   [provider]  naproxen (EC NAPROSYN) 500 MG EC tablet SMARTSIG:1 Tablet(s) By Mouth Every 12 Hours PRN 03/21/21   [provider]  NIFEdipine (PROCARDIA-XL/NIFEDICAL-XL) 30 MG 24 hr tablet  07/07/20   [provider]  ondansetron (ZOFRAN-ODT) 4 MG disintegrating tablet Take 1 tablet (4 mg total) by mouth every 8 (eight) hours as needed for nausea or vomiting. 06/24/23   Jeannie Fend, PA-C      Allergies     Doxycycline and Nsaids    Review of Systems   Review of Systems  Eyes:  Positive for photophobia.  Gastrointestinal:  Positive for nausea.  Neurological:  Positive for headaches.    Physical Exam Updated Vital Signs BP 115/67   Pulse (!) 59   Temp 97.7 F (36.5 C) (Oral)   Resp 20   Ht 5\' 6"  (1.676 m)   Wt 94.3 kg   SpO2 98%   BMI 33.57 kg/m  Physical Exam Vitals and nursing note reviewed.  Constitutional:      General: She is not in acute distress.    Appearance: She is not ill-appearing.  HENT:     Head: Normocephalic.  Eyes:     Pupils: Pupils are equal, round, and reactive to light.  Cardiovascular:     Rate and Rhythm: Normal rate and regular rhythm.     Pulses: Normal pulses.     Heart sounds: Normal heart sounds. No murmur heard.    No friction rub. No gallop.  Pulmonary:     Effort: Pulmonary effort is normal.     Breath sounds: Normal breath sounds.  Abdominal:     General: Abdomen is flat. There is no distension.     Palpations: Abdomen is soft.     Tenderness: There is no abdominal tenderness. There is no guarding or rebound.  Musculoskeletal:        General: Normal range of motion.  Cervical back: Neck supple.  Skin:    General: Skin is warm and dry.  Neurological:     General: No focal deficit present.     Mental Status: She is alert.     Comments: Speech is clear, able to follow commands CN III-XII intact Normal strength in upper and lower extremities bilaterally including dorsiflexion and plantar flexion, strong and equal grip strength Sensation grossly intact throughout Moves extremities without ataxia, coordination intact No pronator drift   Psychiatric:        Mood and Affect: Mood normal.        Behavior: Behavior normal.     ED Results / Procedures / Treatments   Labs (all labs ordered are listed, but only abnormal results are displayed) Labs Reviewed  CBC WITH DIFFERENTIAL/PLATELET - Abnormal; Notable for the following  components:      Result Value   Neutro Abs 1.6 (*)    All other components within normal limits  BASIC METABOLIC PANEL - Abnormal; Notable for the following components:   Creatinine, Ser 1.03 (*)    GFR, Estimated 59 (*)    All other components within normal limits    EKG None  Radiology CT ANGIO HEAD NECK W WO CM  Result Date: 10/10/2023 CLINICAL DATA:  Subarachnoid hemorrhage concern. Woke up with headache. History of migraines. EXAM: CT ANGIOGRAPHY HEAD AND NECK WITH AND WITHOUT CONTRAST TECHNIQUE: Multidetector CT imaging of the head and neck was performed using the standard protocol during bolus administration of intravenous contrast. Multiplanar CT image reconstructions and MIPs were obtained to evaluate the vascular anatomy. Carotid stenosis measurements (when applicable) are obtained utilizing NASCET criteria, using the distal internal carotid diameter as the denominator. RADIATION DOSE REDUCTION: This exam was performed according to the departmental dose-optimization program which includes automated exposure control, adjustment of the mA and/or kV according to patient size and/or use of iterative reconstruction technique. CONTRAST:  75mL OMNIPAQUE IOHEXOL 350 MG/ML SOLN COMPARISON:  None Available. FINDINGS: CT HEAD FINDINGS Brain: No evidence of acute infarction, hemorrhage, hydrocephalus, extra-axial collection or mass lesion/mass effect. Vascular: See below Skull: Normal. Negative for fracture or focal lesion. Sinuses/Orbits: No acute finding. Review of the MIP images confirms the above findings CTA NECK FINDINGS Aortic arch: Unremarkable.  Three vessel branching. Right carotid system: Limited atheromatous change at the bifurcation. No stenosis, beading, or ulceration. Left carotid system: Limited atheromatous change at the bifurcation. No stenosis, beading, or ulceration. The left ICA is smaller than the right due to circle-of-Willis anatomy. Vertebral arteries: No subclavian or  vertebral stenosis, beading, or ulceration. Some degraded visualization of the vertebral arteries from streak artifact. Skeleton: C3-C7 ACDF with solid arthrodesis. Other neck: No evidence of mass or inflammation. Upper chest: Biapical pleural based scarring and reticulation. Review of the MIP images confirms the above findings CTA HEAD FINDINGS Anterior circulation: Atheromatous calcification of the cavernous carotids. No branch occlusion, beading, aneurysm, or vascular malformation. Posterior circulation: The vertebral and basilar arteries are smoothly contoured and diffusely patent. Hypoplastic P1 segments, especially on the right. No branch occlusion, beading, or aneurysm Venous sinuses: Unremarkable Anatomic variants: As above Review of the MIP images confirms the above findings IMPRESSION: 1. No emergent finding or specific cause for headache. 2. Mild atherosclerosis, especially for age. Electronically Signed   By: Tiburcio Pea M.D.   On: 10/10/2023 08:40    Procedures Procedures    Medications Ordered in ED Medications  prochlorperazine (COMPAZINE) injection 10 mg (10 mg Intravenous Given 10/10/23 0730)  diphenhydrAMINE (BENADRYL) injection 12.5 mg (12.5 mg Intravenous Given 10/10/23 0730)  iohexol (OMNIPAQUE) 350 MG/ML injection 75 mL (75 mLs Intravenous Contrast Given 10/10/23 0820)  acetaminophen (TYLENOL) tablet 650 mg (650 mg Oral Given 10/10/23 5643)    ED Course/ Medical Decision Making/ A&P Clinical Course as of 10/10/23 0930  Thu Oct 10, 2023  3295 Reassessed patient at bedside.  Patient admits to some improvement in headache.  Awaiting CTA head/neck results. Will add toradol pending CTA results.  [CA]  [id number] Patient allergic to NSAIDs. Reassessed patient. She admits to significant improvement; however still slight headache. Tylenol given per request.  [CA]    Clinical Course User Index [CA] Mannie Stabile, PA-C                                 Medical Decision  Making Amount and/or Complexity of Data Reviewed Labs: ordered. Decision-making details documented in ED Course. Radiology: ordered and independent interpretation performed. Decision-making details documented in ED Course.  Risk OTC drugs. Prescription drug management.   This patient presents to the ED for concern of headache, this involves an extensive number of treatment options, and is a complaint that carries with it a high risk of complications and morbidity.  The differential diagnosis includes migraine, SAH, infection, etc  67 year old female presents to the ED due to "worst headache of her life" that woke her from her sleep around 5:30 AM. Headache located in right occipital region. History of migraines however, notes it feels different.  No speech or visual changes.  Upon arrival patient afebrile, not tachycardic or hypoxic.  Patient in no acute distress.  Normal neurological exam without any neurological deficits.  Migraine cocktail given.  Routine labs ordered.  CTA head/neck.  No meningismus on exam to suggest meningitis.  CBC reassuring.  No leukocytosis.  Normal hemoglobin.  BMP reassuring.  No major electrolyte derangements.  CTA head and neck personally reviewed and interpreted which is negative for any acute abnormalities.  Upon reassessment, patient admits to a significant improvement in headache.  Low suspicion for any emergent etiologies of headache.  Possible migraine.  Patient stable for discharge. Strict ED precautions discussed with patient. Patient states understanding and agrees to plan. Patient discharged home in no acute distress and stable vitals  Co morbidities that complicate the patient evaluation  Hx of migraines  Social Determinants of Health:  Elderly >65  Test / Admission - Considered:  MRI brain; however low suspicion for CVA  Discussed with Dr. Charm Barges who evaluated patient at bedside and agrees with assessment and plan.        Final Clinical  Impression(s) / ED Diagnoses Final diagnoses:  Acute nonintractable headache, unspecified headache type    Rx / DC Orders ED Discharge Orders     None         Mannie Stabile, PA-C 10/10/23 0930    Terrilee Files, MD 10/10/23 [phone]

## 2023-11-25 ENCOUNTER — Emergency Department (HOSPITAL_COMMUNITY)
Admission: EM | Admit: 2023-11-25 | Discharge: 2023-11-25 | Disposition: A | Discharge status: Home / Self Care | Payer: Medicare Other | Attending: Emergency Medicine | Admitting: Emergency Medicine

## 2023-11-25 ENCOUNTER — Other Ambulatory Visit: Payer: Self-pay

## 2023-11-25 ENCOUNTER — Emergency Department (HOSPITAL_COMMUNITY): Payer: Medicare Other

## 2023-11-25 ENCOUNTER — Encounter (HOSPITAL_COMMUNITY): Payer: Self-pay | Admitting: Emergency Medicine

## 2023-11-25 DIAGNOSIS — R197 Diarrhea, unspecified: Secondary | ICD-10-CM | POA: Insufficient documentation

## 2023-11-25 DIAGNOSIS — Z79899 Other long term (current) drug therapy: Secondary | ICD-10-CM | POA: Diagnosis not present

## 2023-11-25 DIAGNOSIS — N858 Other specified noninflammatory disorders of uterus: Secondary | ICD-10-CM | POA: Insufficient documentation

## 2023-11-25 DIAGNOSIS — D7389 Other diseases of spleen: Secondary | ICD-10-CM

## 2023-11-25 DIAGNOSIS — R1110 Vomiting, unspecified: Secondary | ICD-10-CM | POA: Diagnosis present

## 2023-11-25 DIAGNOSIS — N859 Noninflammatory disorder of uterus, unspecified: Secondary | ICD-10-CM

## 2023-11-25 LAB — CBC
HCT: 41.1 % (ref 36.0–46.0)
Hemoglobin: 13.4 g/dL (ref 12.0–15.0)
MCH: 27.7 pg (ref 26.0–34.0)
MCHC: 32.6 g/dL (ref 30.0–36.0)
MCV: 84.9 fL (ref 80.0–100.0)
Platelets: 229 10*3/uL (ref 150–400)
RBC: 4.84 MIL/uL (ref 3.87–5.11)
RDW: 13.9 % (ref 11.5–15.5)
WBC: 3.7 10*3/uL — ABNORMAL LOW (ref 4.0–10.5)
nRBC: 0 % (ref 0.0–0.2)

## 2023-11-25 LAB — COMPREHENSIVE METABOLIC PANEL
ALT: 17 U/L (ref 0–44)
AST: 18 U/L (ref 15–41)
Albumin: 3.8 g/dL (ref 3.5–5.0)
Alkaline Phosphatase: 76 U/L (ref 38–126)
Anion gap: 9 (ref 5–15)
BUN: 8 mg/dL (ref 8–23)
CO2: 27 mmol/L (ref 22–32)
Calcium: 9.5 mg/dL (ref 8.9–10.3)
Chloride: 106 mmol/L (ref 98–111)
Creatinine, Ser: 0.96 mg/dL (ref 0.44–1.00)
GFR, Estimated: >60 mL/min (ref >60)
Glucose, Bld: 86 mg/dL (ref 70–99)
Potassium: 3.4 mmol/L — ABNORMAL LOW (ref 3.5–5.1)
Sodium: 142 mmol/L (ref 135–145)
Total Bilirubin: 0.8 mg/dL (ref 0.0–1.2)
Total Protein: 6.7 g/dL (ref 6.5–8.1)

## 2023-11-25 LAB — URINALYSIS, ROUTINE W REFLEX MICROSCOPIC
Bilirubin Urine: NEGATIVE
Glucose, UA: NEGATIVE mg/dL
Hgb urine dipstick: NEGATIVE
Ketones, ur: NEGATIVE mg/dL
Nitrite: NEGATIVE
Protein, ur: 30 mg/dL — AB
Specific Gravity, Urine: 1.024 (ref 1.005–1.030)
pH: 5 (ref 5.0–8.0)

## 2023-11-25 LAB — LIPASE, BLOOD: Lipase: 35 U/L (ref 11–51)

## 2023-11-25 MED ORDER — IOHEXOL 350 MG/ML SOLN
75.0000 mL | Freq: Once | INTRAVENOUS | Status: AC | PRN
  Administered 2023-11-25: 75 mL via INTRAVENOUS

## 2023-11-25 MED ORDER — ONDANSETRON 4 MG PO TBDP
4.0000 mg | ORAL_TABLET | Freq: Once | ORAL | Status: AC
  Administered 2023-11-25: 4 mg via ORAL
  Filled 2023-11-25: qty 1

## 2023-11-25 MED ORDER — POTASSIUM CHLORIDE CRYS ER 20 MEQ PO TBCR
20.0000 meq | EXTENDED_RELEASE_TABLET | Freq: Once | ORAL | Status: AC
  Administered 2023-11-25: 20 meq via ORAL
  Filled 2023-11-25: qty 1

## 2023-11-25 MED ORDER — ONDANSETRON 4 MG PO TBDP: 4.0000 mg | ORAL_TABLET | Freq: Three times a day (TID) | ORAL | 0 refills | Status: AC | PRN

## 2023-11-25 NOTE — Discharge Instructions (Addendum)
 Your CT scan showed some abnormal findings on your uterus and your spleen.  The uterus will need to be followed up with gynecology as it is unclear what is causing this lesion.  The spleen lesion will need to be followed up with your family doctor/primary care provider.  You should also follow-up with your gastroenterologist and we are prescribing a nausea medicine to help with your symptoms.  For now use a bland diet and you can consider Imodium  for diarrhea.  You develop new or worsening or uncontrolled vomiting, blood in the stools, or any other new/concerning symptoms then return to the ER or call 9 1.

## 2023-11-25 NOTE — ED Provider Notes (Signed)
 [place] EMERGENCY DEPARTMENT AT [facility] Provider Note   CSN: [id number] Arrival date & time: 11/25/23  1128     History  Chief Complaint  Patient presents with   Emesis    Kylie Hudson is a 68 y.o. female.  HPI 68 year old female presents with vomiting, diarrhea and weight loss.  Originally started on 11/05/2023.  She was out of town for holidays and went to an outside ER in [place]  multiple times.  She had received multiple bags of fluids and at 1 point was given some pill that she is not sure what it was.  However she states no imaging/evaluation has been performed.  She does not have any abdominal pain.  Whenever she drinks or eats it seems to go right through her and she will have diarrhea.  There is no blood.  However if she does not eat or drink she does not have diarrhea.  She is urinating normally but often times will have vomiting after trying to eat.  She denies fevers, chest pain, shortness of breath, cough, abdominal pain or bloating.  She has had about 10 pounds of weight loss during this time.  She reports she has 1 kidney from having a nephrectomy from kidney cancer about 7 years ago.  She denies other comorbidities such as hypertension, diabetes, etc.  Home Medications Prior to Admission medications   Medication Sig Start Date End Date Taking? Authorizing Provider  ondansetron  (ZOFRAN -ODT) 4 MG disintegrating tablet Take 1 tablet (4 mg total) by mouth every 8 (eight) hours as needed for nausea or vomiting. 11/25/23  Yes Freddi Hamilton, MD  benzonatate (TESSALON) 200 MG capsule Take 200 mg by mouth 3 (three) times daily as needed. 02/15/21   [provider]  losartan (COZAAR) 50 MG tablet Take 1 tablet by mouth daily. 02/21/21   [provider]  methocarbamol  (ROBAXIN ) 750 MG tablet Take 1 tablet (750 mg total) by mouth 2 (two) times daily. 08/24/23   Odell Balls, PA-C  metoprolol succinate (TOPROL-XL) 100 MG 24 hr tablet  06/27/20    [provider]  naproxen (EC NAPROSYN) 500 MG EC tablet SMARTSIG:1 Tablet(s) By Mouth Every 12 Hours PRN 03/21/21   [provider]  NIFEdipine (PROCARDIA-XL/NIFEDICAL-XL) 30 MG 24 hr tablet  07/07/20   [provider]      Allergies    Doxycycline and Nsaids    Review of Systems   Review of Systems  Constitutional:  Positive for unexpected weight change. Negative for fever.  Respiratory:  Negative for cough and shortness of breath.   Cardiovascular:  Negative for chest pain.  Gastrointestinal:  Positive for diarrhea and vomiting. Negative for abdominal pain and blood in stool.  Genitourinary:  Negative for decreased urine volume and dysuria.  Musculoskeletal:  Negative for back pain.    Physical Exam Updated Vital Signs BP (!) 153/74 (BP Location: Left Arm)   Pulse (!) 53   Temp 98.1 F (36.7 C)   Resp 18   SpO2 100%  Physical Exam Vitals and nursing note reviewed.  Constitutional:      General: She is not in acute distress.    Appearance: She is well-developed. She is not ill-appearing or diaphoretic.  HENT:     Head: Normocephalic and atraumatic.  Cardiovascular:     Rate and Rhythm: Normal rate and regular rhythm.     Heart sounds: Normal heart sounds.  Pulmonary:     Effort: Pulmonary effort is normal.  Breath sounds: Normal breath sounds.  Abdominal:     General: There is no distension.     Palpations: Abdomen is soft.     Tenderness: There is no abdominal tenderness.  Skin:    General: Skin is warm and dry.  Neurological:     Mental Status: She is alert.     ED Results / Procedures / Treatments   Labs (all labs ordered are listed, but only abnormal results are displayed) Labs Reviewed  COMPREHENSIVE METABOLIC PANEL - Abnormal; Notable for the following components:      Result Value   Potassium 3.4 (*)    All other components within normal limits  CBC - Abnormal; Notable for the following components:   WBC 3.7 (*)    All  other components within normal limits  URINALYSIS, ROUTINE W REFLEX MICROSCOPIC - Abnormal; Notable for the following components:   APPearance HAZY (*)    Protein, ur 30 (*)    Leukocytes,Ua LARGE (*)    Bacteria, UA FEW (*)    Non Squamous Epithelial 0-5 (*)    All other components within normal limits  LIPASE, BLOOD    EKG None  Radiology US  Pelvis Complete Result Date: 11/25/2023 CLINICAL DATA:  ?cancer? EXAM: TRANSABDOMINAL ULTRASOUND OF PELVIS TECHNIQUE: Transabdominal ultrasound examination of the pelvis was performed including evaluation of the uterus, ovaries, adnexal regions, and pelvic cul-de-sac. COMPARISON:  CT abdomen pelvis 11/25/2023 FINDINGS: Uterus Measurements: 7.1 x 2.1 x 3.6 cm = volume: 20.6 mL. Unclear location of calcifications measuring up to 0.5 cm-either within the myometrium or endometrium. Endometrium Thickness: 3 mm. Right ovary Not visualized Left ovary Not visualized. Other findings:  No abnormal free fluid. IMPRESSION: Unclear location of calcifications measuring up to 0.5 cm-either within the myometrium or endometrium. Recommend gynecologic consultation for further evaluation. Electronically Signed   By: Morgane  Naveau M.D.   On: 11/25/2023 23:00   CT ABDOMEN PELVIS W CONTRAST Result Date: 11/25/2023 CLINICAL DATA:  Bowel obstruction suspected Pt reports emesis that started on 12/25. Pt reports she is here for fluids. Reports chills yesterday. Denies pain. EXAM: CT ABDOMEN AND PELVIS WITH CONTRAST TECHNIQUE: Multidetector CT imaging of the abdomen and pelvis was performed using the standard protocol following bolus administration of intravenous contrast. RADIATION DOSE REDUCTION: This exam was performed according to the departmental dose-optimization program which includes automated exposure control, adjustment of the mA and/or kV according to patient size and/or use of iterative reconstruction technique. CONTRAST:  75mL OMNIPAQUE  IOHEXOL  350 MG/ML SOLN  COMPARISON:  CT abdomen pelvis 06/24/2023 FINDINGS: Lower chest: Small hiatal hernia.  No acute abnormality. Hepatobiliary: Subcentimeter hypodensities likely represent simple hepatic cysts. Status post cholecystectomy. No biliary dilatation. Pancreas: No focal lesion. Normal pancreatic contour. No surrounding inflammatory changes. No main pancreatic ductal dilatation. Spleen: Normal in size. Heterogeneous appearance of the spleen with slightly more conspicuous multiple hypodense lesions with as an example a 1.8 cm mass (6:59). Adrenals/Urinary Tract: No adrenal nodule bilaterally. Status post right nephrectomy. The left kidney enhances homogeneously. No hydronephrosis. No hydroureter. Similar-appearing urinary bladder diverticula with associated 3 mm calcification. Otherwise the urinary bladder is unremarkable. Stomach/Bowel: Stomach is within normal limits. No evidence of bowel wall thickening or dilatation. Colonic diverticulosis. Appendix appears normal. Vascular/Lymphatic: No abdominal aorta or iliac aneurysm. No abdominal, pelvic, or inguinal lymphadenopathy. Reproductive: Bilateral tubal ligation. Interval short-term increase in size of a coarsely centrally calcified 2.4 cm (from 1.2 cm) hypodense lesion along the uterine fundus/endometrium. Otherwise uterus and bilateral adnexa are unremarkable.  Other: No intraperitoneal free fluid. No intraperitoneal free gas. No organized fluid collection. Musculoskeletal: No abdominal wall hernia or abnormality. No suspicious lytic or blastic osseous lesions. No acute displaced fracture. Intervertebral disc space vacuum phenomenon and endplate sclerosis at the L5-S1 level. Intervertebral disc space vacuum phenomenon at the L4-L5 level. IMPRESSION: 1. Interval short-term increase in size of a coarsely centrally calcified 2.4 cm (from 1.2 cm) hypodense lesion along the uterine fundus/endometrium. Given increased in size, recommend pelvic ultrasound for further evaluation.  Underlying malignancy is not excluded. 2. Small hiatal hernia. 3. Colonic diverticulosis with no acute diverticulitis. 4. Heterogeneous appearance of the spleen with slightly more conspicuous multiple hypodense lesions. Lesions are indeterminate with lymphoproliferative disorder not excluded. Comparison with prior cross-sectional imaging before 06/24/2023 to evaluate for stability would be of value. Recommend attention on follow-up if long-term comparison not obtainable. 5. Similar-appearing urinary bladder diverticula with associated 3 mm calcification. 6. Status post nephrectomy. Electronically Signed   By: Morgane  Naveau M.D.   On: 11/25/2023 19:25    Procedures Procedures    Medications Ordered in ED Medications  ondansetron  (ZOFRAN -ODT) disintegrating tablet 4 mg (4 mg Oral Given 11/25/23 1550)  potassium chloride  SA (KLOR-CON  M) CR tablet 20 mEq (20 mEq Oral Given 11/25/23 1550)  iohexol  (OMNIPAQUE ) 350 MG/ML injection 75 mL (75 mLs Intravenous Contrast Given 11/25/23 1750)    ED Course/ Medical Decision Making/ A&P                                 Medical Decision Making Amount and/or Complexity of Data Reviewed Labs: ordered.    Details: UA appears contaminated K mildly low at 3.4. Radiology: ordered and independent interpretation performed.    Details: No bowel obstruction.  Risk Prescription drug management.   Patient presents with on and off vomiting and on and off diarrhea.  CT was obtained.  Labs are unremarkable.  CT shows abnormal spleen but also an abnormal uterus that is concerning for possible mass.  Pelvic ultrasound was obtained given recommendation but is unclear.  However at this point I do not think this is an emergent condition but she will need urgent follow-up with GYN and I have referred her as such.  She also needs a follow-up with her PCP.  She feels like the Zofran  helped a lot and she was able to tolerate the potassium orally.  Will give her prescription for  Zofran  and have her follow-up with her PCP for the spleen follow-up as well as GI for her GI symptoms.  Given return precautions.        Final Clinical Impression(s) / ED Diagnoses Final diagnoses:  Vomiting and diarrhea  Lesion of uterus  Lesion of spleen    Rx / DC Orders ED Discharge Orders          Ordered    ondansetron  (ZOFRAN -ODT) 4 MG disintegrating tablet  Every 8 hours PRN        11/25/23 2311              Freddi Hamilton, MD 11/25/23 2327

## 2023-11-25 NOTE — ED Triage Notes (Signed)
 Pt reports emesis that started on 12/25. Pt reports she is here for fluids. Reports chills yesterday. Denies pain.

## 2023-11-25 NOTE — ED Provider Triage Note (Signed)
 Emergency Medicine Provider Triage Evaluation Note  Kylie Hudson , a 68 y.o. female  was evaluated in triage.  Pt complains of nausea and vomiting.  States that same began on Christmas day and has been persistent since then.  She has been out of town and has had to go to the emergency department several times to receive IV fluids.  She denies any abdominal pain.  States she has been throwing up every single day since Christmas.  Denies fevers or chills.  Is having regular bowel movements and passing flatus.  Review of Systems  Positive:  Negative:   Physical Exam  BP (!) 154/79   Pulse 62   Temp 98.2 F (36.8 C) (Oral)   Resp 20   SpO2 99%  Gen:   Awake, no distress   Resp:  Normal effort  MSK:   Moves extremities without difficulty  Other:  No active emesis during exam  Medical Decision Making  Medically screening exam initiated at 1:03 PM.  Appropriate orders placed.  Kylie Hudson was informed that the remainder of the evaluation will be completed by another provider, this initial triage assessment does not replace that evaluation, and the importance of remaining in the ED until their evaluation is complete.  Workup initiated   Heath Badon A, PA-C 11/25/23 1304

## 2023-11-26 ENCOUNTER — Encounter: Payer: Self-pay | Admitting: Physician Assistant

## 2023-11-29 HISTORY — PX: COLONOSCOPY: SHX174

## 2023-11-29 HISTORY — PX: ESOPHAGOGASTRODUODENOSCOPY: SHX1529

## 2023-11-29 LAB — HM COLONOSCOPY

## 2023-12-11 ENCOUNTER — Ambulatory Visit (INDEPENDENT_AMBULATORY_CARE_PROVIDER_SITE_OTHER): Payer: Medicare Other | Admitting: Physician Assistant

## 2023-12-11 ENCOUNTER — Encounter: Payer: Self-pay | Admitting: Physician Assistant

## 2023-12-11 VITALS — BP 114/68 | HR 68 | Ht 66.0 in | Wt 192.2 lb

## 2023-12-11 DIAGNOSIS — R112 Nausea with vomiting, unspecified: Secondary | ICD-10-CM

## 2023-12-11 DIAGNOSIS — R1013 Epigastric pain: Secondary | ICD-10-CM

## 2023-12-11 DIAGNOSIS — R935 Abnormal findings on diagnostic imaging of other abdominal regions, including retroperitoneum: Secondary | ICD-10-CM | POA: Diagnosis not present

## 2023-12-11 DIAGNOSIS — R197 Diarrhea, unspecified: Secondary | ICD-10-CM

## 2023-12-11 MED ORDER — PANTOPRAZOLE SODIUM 40 MG PO TBEC: 40.0000 mg | DELAYED_RELEASE_TABLET | Freq: Every day | ORAL | 5 refills | Status: DC

## 2023-12-11 NOTE — Patient Instructions (Signed)
  You have been scheduled for an MRI at 12/20/2023 on 3:30pm. Your appointment time is . Please arrive to admitting (at main entrance of the hospital) 30 minutes prior to your appointment time for registration purposes. Please make certain not to have anything to eat or drink 6 hours prior to your test. In addition, if you have any metal in your body, have a pacemaker or defibrillator, please be sure to let your ordering physician know. This test typically takes 45 minutes to 1 hour to complete. Should you need to reschedule, please call [phone] to do so.   We have sent the following medications to your pharmacy for you to pick up at your convenience: Pantoprazole  _______________________________________________________  If your blood pressure at your visit was 140/90 or greater, please contact your primary care physician to follow up on this.  _______________________________________________________  If you are age 39 or older, your body mass index should be between 23-30. Your Body mass index is 31.03 kg/m. If this is out of the aforementioned range listed, please consider follow up with your Primary Care Provider.  If you are age 64 or younger, your body mass index should be between 19-25. Your Body mass index is 31.03 kg/m. If this is out of the aformentioned range listed, please consider follow up with your Primary Care Provider.   ________________________________________________________  The [facility] providers would like to encourage you to use [facility] to communicate with providers for non-urgent requests or questions.  Due to long hold times on the telephone, sending your provider a message by [facility] may be a faster and more efficient way to get a response.  Please allow 48 business hours for a response.  Please remember that this is for non-urgent requests.  _______________________________________________________ It was a pleasure to see you today!  Thank you for trusting me with  your gastrointestinal care!

## 2023-12-11 NOTE — Progress Notes (Signed)
Chief Complaint: Abdominal Pain  HPI:   Kylie Hudson is a 68 y/o female with a past medical history as listed below   who was referred to me by [facility] for a complaint of abdominal pain.      11/25/2023 patient seen in the ED for suspected bowel obstruction with symptoms of vomiting, diarrhea and weight loss.  With a CT of the abdomen and pelvis showing interval short-term increase in size of a coarsely centrally calcified 2.4 cm hypodense lesion along the uterine fundus/endometrium as well as a small hiatal hernia, colonic diverticulosis, heterogenous appearance of the spleen with slightly more conspicuous multiple hypodense lesions.  Lesions were indeterminate with lymphoproliferative disorder not excluded.  Comparison with prior cross-sectional imaging 06/24/2023 to evaluate for stability would be of value, similar appearing urinary bladder diverticula with associated 3 mm calcification and status post nephrectomy.    11/25/2023 pelvic ultrasound with unclear location of calcifications measuring up to 0.5 cm either within the myometrium or endometrium.  Recommend gynecological eval.    11/25/2023 CBC was mostly well, CMP with potassium 3.4 and otherwise normal.  Lipase normal.    Today, patient presents to clinic and explains that after being seen in the ER last week she actually went to the [place] and apparently had an EGD and colonoscopy just last week for further evaluation of symptoms.  Apparently found to have "gastritis", but biopsies are still pending.  (Unfortunately we do not have these reports).  Tells me that in general all of her symptoms started feeling better on 11/26/2023 but for the 3 weeks prior to that she had profuse nausea, vomiting and diarrhea.  She could not eat anything and got super weak which is why she went to the ER.      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  Apparently no one has looked further at her spleen.      She has already seen the gynecologist who told her she likely has a fibroid but they did a biopsy last week.    Denies fever, chills or weight loss.      Past Medical History:  Diagnosis Date   Kidney disease    Migraine     Past Surgical History:  Procedure Laterality Date   CESAREAN SECTION     CHOLECYSTECTOMY     NEPHRECTOMY     Pt can not remmeebr which kidney was removed.     Current Outpatient Medications  Medication Sig Dispense Refill   benzonatate (TESSALON) 200 MG capsule Take 200 mg by mouth 3 (three) times daily as needed.     losartan (COZAAR) 50 MG tablet Take 1 tablet by mouth daily.     methocarbamol (ROBAXIN) 750 MG tablet Take 1 tablet (750 mg total) by mouth 2 (two) times daily. 20 tablet 0   metoprolol succinate (TOPROL-XL) 100 MG 24 hr tablet      naproxen (EC NAPROSYN) 500 MG EC tablet SMARTSIG:1 Tablet(s) By Mouth Every 12 Hours PRN     NIFEdipine (PROCARDIA-XL/NIFEDICAL-XL) 30 MG 24 hr tablet      ondansetron (ZOFRAN-ODT) 4 MG disintegrating tablet Take 1 tablet (4 mg total) by mouth every 8 (eight) hours as needed for nausea or  vomiting. 10 tablet 0   No current facility-administered medications for this visit.    Allergies as of 12/11/2023 - Review Complete 11/25/2023  Allergen Reaction Noted   Doxycycline  05/27/2017   Nsaids  10/10/2023    No family history on file.  Social History   Socioeconomic History   Marital status: Single    Spouse name: Not on file   Number of children: Not on file   Years of education: Not on file   Highest education level: Not on file  Occupational History   Not on file  Tobacco Use   Smoking status: Never   Smokeless tobacco: Never  Substance and Sexual Activity   Alcohol use: Never   Drug  use: Never   Sexual activity: Yes  Other Topics Concern   Not on file  Social History Narrative   Not on file   Social Drivers of Health   Financial Resource Strain: Not on file  Food Insecurity: Not on file  Transportation Needs: Not on file  Physical Activity: Not on file  Stress: Not on file  Social Connections: Not on file  Intimate Partner Violence: Not on file    Review of Systems:    Constitutional: No weight loss, fever or chills Skin: No rash  Cardiovascular: No chest pain   Respiratory: No SOB Gastrointestinal: See HPI and otherwise negative Genitourinary: No dysuria or change in urinary frequency Neurological: No headache, dizziness or syncope Musculoskeletal: No new muscle or joint pain Hematologic: No bleeding  Psychiatric: No history of depression or anxiety   Physical Exam:  Vital signs: BP 114/68   Pulse 68   Ht 5\' 6"  (1.676 m)   Wt 192 lb 4 oz (87.2 kg)   SpO2 97%   BMI 31.03 kg/m    Constitutional:   Pleasant overweight AA female appears to be in NAD, Well developed, Well nourished, alert and cooperative Head:  Normocephalic and atraumatic. Eyes:   PEERL, EOMI. No icterus. Conjunctiva pink. Ears:  Normal auditory acuity. Neck:  Supple Throat: Oral cavity and pharynx without inflammation, swelling or lesion.  Respiratory: Respirations even and unlabored. Lungs clear to auscultation bilaterally.   No wheezes, crackles, or rhonchi.  Cardiovascular: Normal S1, S2. No MRG. Regular rate and rhythm. No peripheral edema, cyanosis or pallor.  Gastrointestinal:  Soft, nondistended, mild epigastric TTP, no rebound or guarding. Normal bowel sounds. No appreciable masses or hepatomegaly. Rectal:  Not performed.  Msk:  Symmetrical without gross deformities. Without edema, no deformity or joint abnormality.  Neurologic:  Alert and  oriented x4;  grossly normal neurologically.  Skin:   Dry and intact without significant lesions or rashes. Psychiatric:  Demonstrates good judgement and reason without abnormal affect or behaviors.  RELEVANT LABS AND IMAGING: CBC    Component Value Date/Time   WBC 3.7 (L) 11/25/2023 1225   RBC 4.84 11/25/2023 1225   HGB 13.4 11/25/2023 1225   HCT 41.1 11/25/2023 1225   PLT 229 11/25/2023 1225   MCV 84.9 11/25/2023 1225   MCH 27.7 11/25/2023 1225   MCHC 32.6 11/25/2023 1225   RDW 13.9 11/25/2023 1225   LYMPHSABS 2.0 10/10/2023 0724   MONOABS 0.3 10/10/2023 0724   EOSABS 0.1 10/10/2023 0724   BASOSABS 0.0 10/10/2023 0724    CMP     Component Value Date/Time   NA 142 11/25/2023 1225   K 3.4 (L) 11/25/2023 1225   CL 106 11/25/2023 1225   CO2 27 11/25/2023 1225   GLUCOSE 86 11/25/2023  1225   BUN 8 11/25/2023 1225   CREATININE 0.96 11/25/2023 1225   CALCIUM 9.5 11/25/2023 1225   PROT 6.7 11/25/2023 1225   ALBUMIN 3.8 11/25/2023 1225   AST 18 11/25/2023 1225   ALT 17 11/25/2023 1225   ALKPHOS 76 11/25/2023 1225   BILITOT 0.8 11/25/2023 1225   GFRNONAA >60 11/25/2023 1225    Assessment: 1.  Abnormal imaging of the abdomen: Lesion in her uterus, already biopsied by gynecology and and heterogenous spleen with recommendations for follow-up imaging 2.  Epigastric pain: With below 3.  Nausea and vomiting: Started with what sounds like a viral or possible infectious gastroenteritis at the beginning of January that lasted for 3 weeks, now doing better, had an EGD and colonoscopy last week with biopsies pending, did find gastritis per the patient 4.  Diarrhea: With above  Plan: 1.  We will request records from patient's recent EGD and colonoscopy at the [place] last week.  Patient tells me she had gastritis, but biopsies are still pending. 2.  Started the patient on Pantoprazole 40 mg daily, 30-60 minutes before breakfast.  #30 with 5 refills. 3.  Will order an MRI of the abdomen and pelvis for further evaluation of abnormal CT of the spleen and uterus.  This will be done with contrast. 4.  Patient  assigned to Dr. Chales Abrahams this morning.  She will follow in clinic per recommendations after imaging and once we are able to review her recent procedures.  Hyacinth Meeker, PA-C [facility] 12/11/2023, 11:24 AM  Cc: [facility]

## 2023-12-18 ENCOUNTER — Telehealth: Payer: Self-pay | Admitting: Physician Assistant

## 2023-12-18 NOTE — Telephone Encounter (Signed)
 Inbound call from patient, states she has been having diarrhea for two days. Patient is requesting medication called in to [facility].

## 2023-12-19 NOTE — Telephone Encounter (Signed)
 Called and spoke with patient. Pt states that she has been having diarrhea and was requesting a prescription. Pt has only tried 1 tablet of Imodium  daily, she has been advised that she can take up to 6 tablets of Imodium  daily. Further recommendations to follow MRI. Pt verbalized understanding and had no concerns at the end of the call.

## 2023-12-20 ENCOUNTER — Ambulatory Visit (HOSPITAL_COMMUNITY): Admission: RE | Admit: 2023-12-20 | Payer: Medicare Other | Source: Ambulatory Visit

## 2023-12-21 ENCOUNTER — Ambulatory Visit (HOSPITAL_COMMUNITY)
Admission: EM | Admit: 2023-12-21 | Discharge: 2023-12-21 | Disposition: A | Discharge status: Home / Self Care | Payer: Medicare Other | Attending: Internal Medicine | Admitting: Internal Medicine

## 2023-12-21 ENCOUNTER — Encounter (HOSPITAL_COMMUNITY): Payer: Self-pay

## 2023-12-21 DIAGNOSIS — R519 Headache, unspecified: Secondary | ICD-10-CM | POA: Diagnosis not present

## 2023-12-21 MED ORDER — PREDNISONE 20 MG PO TABS: 20.0000 mg | ORAL_TABLET | Freq: Every day | ORAL | 0 refills | Status: AC

## 2023-12-21 MED ORDER — BACLOFEN 20 MG PO TABS: 20.0000 mg | ORAL_TABLET | Freq: Every evening | ORAL | 0 refills | Status: DC

## 2023-12-21 NOTE — ED Triage Notes (Signed)
 Pt states left sided head and neck pain for the past 3 days.  States she has been taking Excedrin at home which helps but the pain keeps coming back.

## 2023-12-21 NOTE — ED Provider Notes (Signed)
 [facility]    CSN: [id number] Arrival date & time: 12/21/23  1231      History   Chief Complaint Chief Complaint  Patient presents with   Headache    HPI Kylie Hudson is a 68 y.o. female who presents with L occipital area and neck pain  for  several months gradually getting worse  but in the past  3  days she feels it is worse. Seems to be awaken with L occipital pulsating pain that radiates to the lateral L neck and may last for 15 minutes. It is not as severe during the day when she feels this pain. The pain is provoked with palpation on this area and movement of her neck to the L. Denies an injury. Has not seen her PCP for this. She has not taken her BP medication today. She checks her BP on occasion. Denies vision changes, dizziness, confusion or trouble speaking.     Past Medical History:  Diagnosis Date   Gastritis    Kidney disease    Migraine     There are no active problems to display for this patient.   Past Surgical History:  Procedure Laterality Date   CESAREAN SECTION     CHOLECYSTECTOMY     NEPHRECTOMY     Pt can not remmeebr which kidney was removed.     OB History     Gravida  2   Para      Term      Preterm      AB  0   Living  2      SAB      IAB      Ectopic      Multiple      Live Births               Home Medications    Prior to Admission medications   Medication Sig Start Date End Date Taking? Authorizing Provider  baclofen  (LIORESAL ) 20 MG tablet Take 1 tablet (20 mg total) by mouth at bedtime. 12/21/23  Yes Rodriguez-Southworth, Kenyatta Keidel, PA-C  predniSONE  (DELTASONE ) 20 MG tablet Take 1 tablet (20 mg total) by mouth daily with breakfast. 12/21/23  Yes Rodriguez-Southworth, Kyra, PA-C  loperamide  (IMODIUM ) 2 MG capsule Take 2 mg by mouth as needed. 08/07/23   [provider]  losartan (COZAAR) 100 MG tablet Take 100 mg by mouth daily. 06/04/23   [provider]  losartan (COZAAR) 50 MG  tablet Take 1 tablet by mouth daily. 02/21/21   [provider]  metoprolol succinate (TOPROL-XL) 100 MG 24 hr tablet  06/27/20   [provider]  mirtazapine (REMERON) 30 MG tablet Take 30 mg by mouth at bedtime. 01/12/19   [provider]  naproxen (EC NAPROSYN) 500 MG EC tablet SMARTSIG:1 Tablet(s) By Mouth Every 12 Hours PRN 03/21/21   [provider]  NIFEdipine (PROCARDIA-XL/NIFEDICAL-XL) 30 MG 24 hr tablet  07/07/20   [provider]  ondansetron  (ZOFRAN -ODT) 4 MG disintegrating tablet Take 1 tablet (4 mg total) by mouth every 8 (eight) hours as needed for nausea or vomiting. 11/25/23   Freddi Hamilton, MD  pantoprazole  (PROTONIX ) 40 MG tablet Take 1 tablet (40 mg total) by mouth daily. 12/11/23   Beather Delon Gibson, PA  SUMAtriptan (IMITREX) 25 MG tablet Take 25 mg by mouth as needed. 01/28/23   [provider]    Family History Family History  Problem Relation Age of Onset   Inflammatory bowel disease  Son     Social History Social History   Tobacco Use   Smoking status: Never   Smokeless tobacco: Never  Vaping Use   Vaping status: Never Used  Substance Use Topics   Alcohol use: Never   Drug use: Never     Allergies   Doxycycline and Nsaids   Review of Systems Review of Systems As noted in HPI  Physical Exam Triage Vital Signs ED Triage Vitals [12/21/23 1325]  Encounter Vitals Group     BP (!) 170/90     Systolic BP Percentile      Diastolic BP Percentile      Pulse Rate 91     Resp 16     Temp 98.1 F (36.7 C)     Temp Source Oral     SpO2 97 %     Weight      Height      Head Circumference      Peak Flow      Pain Score 10     Pain Loc      Pain Education      Exclude from Growth Chart    No data found.  Updated Vital Signs BP (!) 170/90 (BP Location: Left Arm)   Pulse 91   Temp 98.1 F (36.7 C) (Oral)   Resp 16   SpO2 97%   Visual Acuity Right Eye Distance:   Left Eye Distance:    Bilateral Distance:    Right Eye Near:   Left Eye Near:    Bilateral Near:     Physical Exam Vitals and nursing note reviewed.  Constitutional:      General: She is not in acute distress.    Appearance: She is obese. She is not toxic-appearing.  HENT:     Head:     Comments: Has a very tender area on Occipital muscle region that reproduced her pain. The skin in this area is normal and there is no swelling.     Right Ear: External ear normal.     Left Ear: External ear normal.  Eyes:     General: Lids are normal.     Extraocular Movements: Extraocular movements intact.     Pupils: Pupils are equal, round, and reactive to light.  Neck:     Comments: Has tender L upper trapezius and her rotation and lateral flexion of neck to the L provoked pain on her neck and occipital region.  Pulmonary:     Effort: Pulmonary effort is normal.  Musculoskeletal:        General: Normal range of motion.  Skin:    General: Skin is warm and dry.     Findings: No bruising, lesion or rash.  Neurological:     Mental Status: She is alert and oriented to person, place, and time.     Gait: Gait normal.  Psychiatric:        Mood and Affect: Mood normal.        Behavior: Behavior normal.        Thought Content: Thought content normal.        Judgment: Judgment normal.      UC Treatments / Results  Labs (all labs ordered are listed, but only abnormal results are displayed) Labs Reviewed - No data to display  EKG   Radiology No results found.  Procedures Procedures (including critical care time)  Medications Ordered in UC Medications - No data to display  Initial Impression / Assessment and Plan /  UC Course  I have reviewed the triage vital signs and the nursing notes.  Occipital HA Trapezius spasms  I placed her on Prednisone  and Baclofen  as noted Advised to  take her BP every day and monitor her BP and record it. If the medication I am prescribing does not help, needs to go to  ER  Needs to FU with PCP in one week, if the medication helps Final Clinical Impressions(s) / UC Diagnoses   Final diagnoses:  Occipital headache     Discharge Instructions      Take the Prednisone  as soon as you pick it up, and take the muscle relaxer  1 hour at bed time. If this does not help and you wake up with the same pain, go to ER for further work up Use head on area of pain for 20 minutes 3 times a day and do stretches   You may want to switch your sugar to Munkfruit as a more natural sweeteners.      ED Prescriptions     Medication Sig Dispense Auth. Provider   predniSONE  (DELTASONE ) 20 MG tablet Take 1 tablet (20 mg total) by mouth daily with breakfast. 5 tablet Rodriguez-Southworth, Kyra, PA-C   baclofen  (LIORESAL ) 20 MG tablet Take 1 tablet (20 mg total) by mouth at bedtime. 7 each Rodriguez-Southworth, Kyra, PA-C      PDMP not reviewed this encounter.   Lindi Kyra, PA-C 12/21/23 1358

## 2023-12-21 NOTE — Discharge Instructions (Signed)
 Take the Prednisone  as soon as you pick it up, and take the muscle relaxer  1 hour at bed time. If this does not help and you wake up with the same pain, go to ER for further work up Use head on area of pain for 20 minutes 3 times a day and do stretches   You may want to switch your sugar to Munkfruit as a more natural sweeteners.

## 2023-12-22 ENCOUNTER — Encounter (HOSPITAL_COMMUNITY): Payer: Self-pay

## 2023-12-22 ENCOUNTER — Emergency Department (HOSPITAL_COMMUNITY): Payer: Medicare Other

## 2023-12-22 ENCOUNTER — Emergency Department (HOSPITAL_COMMUNITY)
Admission: EM | Admit: 2023-12-22 | Discharge: 2023-12-22 | Disposition: A | Discharge status: Home / Self Care | Payer: Medicare Other | Attending: Emergency Medicine | Admitting: Emergency Medicine

## 2023-12-22 ENCOUNTER — Other Ambulatory Visit: Payer: Self-pay

## 2023-12-22 DIAGNOSIS — M5481 Occipital neuralgia: Secondary | ICD-10-CM | POA: Insufficient documentation

## 2023-12-22 DIAGNOSIS — Z981 Arthrodesis status: Secondary | ICD-10-CM | POA: Diagnosis not present

## 2023-12-22 DIAGNOSIS — R519 Headache, unspecified: Secondary | ICD-10-CM | POA: Diagnosis present

## 2023-12-22 DIAGNOSIS — R200 Anesthesia of skin: Secondary | ICD-10-CM | POA: Diagnosis not present

## 2023-12-22 DIAGNOSIS — I672 Cerebral atherosclerosis: Secondary | ICD-10-CM | POA: Insufficient documentation

## 2023-12-22 LAB — CBC
HCT: 44.4 % (ref 36.0–46.0)
Hemoglobin: 14.2 g/dL (ref 12.0–15.0)
MCH: 27.5 pg (ref 26.0–34.0)
MCHC: 32 g/dL (ref 30.0–36.0)
MCV: 86 fL (ref 80.0–100.0)
Platelets: 214 10*3/uL (ref 150–400)
RBC: 5.16 MIL/uL — ABNORMAL HIGH (ref 3.87–5.11)
RDW: 14.1 % (ref 11.5–15.5)
WBC: 4.8 10*3/uL (ref 4.0–10.5)
nRBC: 0 % (ref 0.0–0.2)

## 2023-12-22 LAB — COMPREHENSIVE METABOLIC PANEL
ALT: 12 U/L (ref 0–44)
AST: 16 U/L (ref 15–41)
Albumin: 4.1 g/dL (ref 3.5–5.0)
Alkaline Phosphatase: 83 U/L (ref 38–126)
Anion gap: 11 (ref 5–15)
BUN: 12 mg/dL (ref 8–23)
CO2: 25 mmol/L (ref 22–32)
Calcium: 9.9 mg/dL (ref 8.9–10.3)
Chloride: 105 mmol/L (ref 98–111)
Creatinine, Ser: 1.01 mg/dL — ABNORMAL HIGH (ref 0.44–1.00)
GFR, Estimated: >60 mL/min (ref >60)
Glucose, Bld: 85 mg/dL (ref 70–99)
Potassium: 3.9 mmol/L (ref 3.5–5.1)
Sodium: 141 mmol/L (ref 135–145)
Total Bilirubin: 0.6 mg/dL (ref 0.0–1.2)
Total Protein: 7.4 g/dL (ref 6.5–8.1)

## 2023-12-22 LAB — DIFFERENTIAL
Abs Immature Granulocytes: 0.01 10*3/uL (ref 0.00–0.07)
Basophils Absolute: 0 10*3/uL (ref 0.0–0.1)
Basophils Relative: 1 %
Eosinophils Absolute: 0.1 10*3/uL (ref 0.0–0.5)
Eosinophils Relative: 3 %
Immature Granulocytes: 0 %
Lymphocytes Relative: 61 %
Lymphs Abs: 2.9 10*3/uL (ref 0.7–4.0)
Monocytes Absolute: 0.4 10*3/uL (ref 0.1–1.0)
Monocytes Relative: 7 %
Neutro Abs: 1.3 10*3/uL — ABNORMAL LOW (ref 1.7–7.7)
Neutrophils Relative %: 28 %

## 2023-12-22 LAB — I-STAT CHEM 8, ED
BUN: 14 mg/dL (ref 8–23)
Calcium, Ion: 1.14 mmol/L — ABNORMAL LOW (ref 1.15–1.40)
Chloride: 107 mmol/L (ref 98–111)
Creatinine, Ser: 1 mg/dL (ref 0.44–1.00)
Glucose, Bld: 81 mg/dL (ref 70–99)
HCT: 43 % (ref 36.0–46.0)
Hemoglobin: 14.6 g/dL (ref 12.0–15.0)
Potassium: 3.9 mmol/L (ref 3.5–5.1)
Sodium: 141 mmol/L (ref 135–145)
TCO2: 26 mmol/L (ref 22–32)

## 2023-12-22 LAB — ETHANOL: Alcohol, Ethyl (B): <10 mg/dL (ref <10)

## 2023-12-22 LAB — APTT: aPTT: 28 s (ref 24–36)

## 2023-12-22 LAB — CBG MONITORING, ED: Glucose-Capillary: 85 mg/dL (ref 70–99)

## 2023-12-22 LAB — PROTIME-INR
INR: 1 (ref 0.8–1.2)
Prothrombin Time: 13.4 s (ref 11.4–15.2)

## 2023-12-22 MED ORDER — BACLOFEN 10 MG PO TABS
20.0000 mg | ORAL_TABLET | Freq: Three times a day (TID) | ORAL | Status: DC
  Administered 2023-12-22: 20 mg via ORAL
  Filled 2023-12-22: qty 2

## 2023-12-22 MED ORDER — SODIUM CHLORIDE 0.9% FLUSH: 3.0000 mL | Freq: Once | INTRAVENOUS | Status: DC

## 2023-12-22 MED ORDER — PREDNISONE 20 MG PO TABS
60.0000 mg | ORAL_TABLET | Freq: Once | ORAL | Status: AC
  Administered 2023-12-22: 60 mg via ORAL
  Filled 2023-12-22: qty 3

## 2023-12-22 NOTE — Discharge Instructions (Signed)
 It was a pleasure taking part in your care.  As we discussed, your cough is reassuring.  Please pick up medication sent to your pharmacy by provider you saw yesterday.  Please read attached guide concerning occipital neuralgia.  Please follow-up with your PCP for reevaluation.  Return to the ED with any new or worsening symptoms.

## 2023-12-22 NOTE — ED Provider Notes (Signed)
 [facility] EMERGENCY DEPARTMENT AT [facility] Provider Note   CSN: [id number] Arrival date & time: 12/22/23  9049     History  Chief Complaint  Patient presents with   Neurologic Problem    Kylie Hudson is a 68 y.o. female with medical history of migraine, gastritis and kidney disease.  Patient presents to the ED for evaluation of left posterior head pain.  The patient reports that for the last month she has had pain to the left posterior portion of her head that is present every day.  Reports that this pain is worse when she lies down at night on a pillow.  States pain is also worse when looking to the left.  Reports that she has been seen by her PCP for this however was not having active symptoms at this time, no treatment was provided.  Patient reports that yesterday her symptoms became so severe that she went to urgent care where she was diagnosed with occipital neuralgia and placed on baclofen  as well as prednisone .  She reports that she has not picked up her medications because the pharmacy was not open.  She states that today her pain became so severe that she had to come to the ED for further management and care, states that she was advised by urgent care provider that if her pain worsen she should report to ED.  She is endorsing pain in the left posterior portion of her head but denies this to be a migraine or headache, states she has a history of these and they do not seem similar.  Reports the pain feels like a ache.  Denies visual deficits, blurred vision, double vision, trouble finding words, dizziness, one-sided weakness or numbness.  Denies chest pain, shortness of breath, nausea, vomiting, abdominal pain.  Denies neck pain.   Neurologic Problem Associated symptoms include headaches.       Home Medications Prior to Admission medications   Medication Sig Start Date End Date Taking? Authorizing Provider  baclofen  (LIORESAL ) 20 MG tablet Take 1 tablet (20 mg  total) by mouth at bedtime. 12/21/23   Rodriguez-Southworth, Sylvia, PA-C  loperamide  (IMODIUM ) 2 MG capsule Take 2 mg by mouth as needed. 08/07/23   [provider]  losartan (COZAAR) 100 MG tablet Take 100 mg by mouth daily. 06/04/23   [provider]  losartan (COZAAR) 50 MG tablet Take 1 tablet by mouth daily. 02/21/21   [provider]  metoprolol succinate (TOPROL-XL) 100 MG 24 hr tablet  06/27/20   [provider]  mirtazapine (REMERON) 30 MG tablet Take 30 mg by mouth at bedtime. 01/12/19   [provider]  naproxen (EC NAPROSYN) 500 MG EC tablet SMARTSIG:1 Tablet(s) By Mouth Every 12 Hours PRN 03/21/21   [provider]  NIFEdipine (PROCARDIA-XL/NIFEDICAL-XL) 30 MG 24 hr tablet  07/07/20   [provider]  ondansetron  (ZOFRAN -ODT) 4 MG disintegrating tablet Take 1 tablet (4 mg total) by mouth every 8 (eight) hours as needed for nausea or vomiting. 11/25/23   Freddi Hamilton, MD  pantoprazole  (PROTONIX ) 40 MG tablet Take 1 tablet (40 mg total) by mouth daily. 12/11/23   Beather Delon Gibson, PA  predniSONE  (DELTASONE ) 20 MG tablet Take 1 tablet (20 mg total) by mouth daily with breakfast. 12/21/23   Rodriguez-Southworth, Kyra, PA-C  SUMAtriptan (IMITREX) 25 MG tablet Take 25 mg by mouth as needed. 01/28/23   [provider]      Allergies    Doxycycline and Nsaids  Review of Systems   Review of Systems  Neurological:  Positive for headaches.  All other systems reviewed and are negative.   Physical Exam Updated Vital Signs BP (!) 152/72   Pulse (!) 55   Temp 97.9 F (36.6 C) (Oral)   Resp 16   Ht 5' 6 (1.676 m)   Wt 87.1 kg   SpO2 100%   BMI 30.99 kg/m  Physical Exam Vitals and nursing note reviewed.  Constitutional:      General: She is not in acute distress.    Appearance: Normal appearance. She is well-developed. She is not ill-appearing, toxic-appearing or diaphoretic.  HENT:     Head: Normocephalic  and atraumatic.  Eyes:     Conjunctiva/sclera: Conjunctivae normal.  Cardiovascular:     Rate and Rhythm: Normal rate and regular rhythm.     Heart sounds: No murmur heard. Pulmonary:     Effort: Pulmonary effort is normal. No respiratory distress.     Breath sounds: Normal breath sounds.  Abdominal:     Palpations: Abdomen is soft.     Tenderness: There is no abdominal tenderness.  Musculoskeletal:        General: No swelling.     Cervical back: Neck supple.     Comments: Positive Tinel's sign about occipital region to the left posterior head  Skin:    General: Skin is warm and dry.     Capillary Refill: Capillary refill takes less than 2 seconds.  Neurological:     General: No focal deficit present.     Mental Status: She is alert.     GCS: GCS eye subscore is 4. GCS verbal subscore is 5. GCS motor subscore is 6.     Cranial Nerves: Cranial nerves 2-12 are intact. No cranial nerve deficit.     Sensory: Sensation is intact. No sensory deficit.     Motor: Motor function is intact. No weakness.     Coordination: Coordination is intact. Coordination normal. Heel to Shin Test normal.  Psychiatric:        Mood and Affect: Mood normal.     ED Results / Procedures / Treatments   Labs (all labs ordered are listed, but only abnormal results are displayed) Labs Reviewed  CBC - Abnormal; Notable for the following components:      Result Value   RBC 5.16 (*)    All other components within normal limits  DIFFERENTIAL - Abnormal; Notable for the following components:   Neutro Abs 1.3 (*)    All other components within normal limits  COMPREHENSIVE METABOLIC PANEL - Abnormal; Notable for the following components:   Creatinine, Ser 1.01 (*)    All other components within normal limits  I-STAT CHEM 8, ED - Abnormal; Notable for the following components:   Calcium, Ion 1.14 (*)    All other components within normal limits  PROTIME-INR  APTT  ETHANOL  CBG MONITORING, ED     EKG EKG Interpretation Date/Time:  Sunday December 22 2023 10:10:03 EST Ventricular Rate:  80 PR Interval:  158 QRS Duration:  64 QT Interval:  380 QTC Calculation: 438 R Axis:   34  Text Interpretation: Normal sinus rhythm Normal ECG When compared with ECG of 23-Jun-2023 20:45, PREVIOUS ECG IS PRESENT No significant change since last tracing Confirmed by Dean Clarity [phone]) on 12/22/2023 11:43:53 AM  Radiology MR BRAIN WO CONTRAST Result Date: 12/22/2023 CLINICAL DATA:  Neuro deficit, acute, stroke suspected. EXAM: MRI HEAD WITHOUT CONTRAST TECHNIQUE: Multiplanar, multiecho  pulse sequences of the brain and surrounding structures were obtained without intravenous contrast. COMPARISON:  CT head without contrast 12/22/2023 FINDINGS: Brain: No acute infarct, hemorrhage, or mass lesion is present. No significant white matter lesions are present. Deep brain nuclei are within normal limits. The ventricles are of normal size. No significant extraaxial fluid collection is present. The brainstem and cerebellum are within normal limits. The internal auditory canals are within normal limits. Midline structures are within normal limits. Vascular: Flow is present in the major intracranial arteries. Skull and upper cervical spine: Cervical fusion is noted at C3-4. Craniocervical junction is normal. Marrow signal is normal. Sinuses/Orbits: The paranasal sinuses and mastoid air cells are clear. The globes and orbits are within normal limits. IMPRESSION: Normal MRI appearance of the brain. No acute or focal lesion to explain the patient's symptoms. Electronically Signed   By: Lonni Necessary M.D.   On: 12/22/2023 13:17   CT HEAD WO CONTRAST Result Date: 12/22/2023 CLINICAL DATA:  Severe left occipital headache. Mild left facial droop. EXAM: CT HEAD WITHOUT CONTRAST TECHNIQUE: Contiguous axial images were obtained from the base of the skull through the vertex without intravenous contrast. RADIATION DOSE  REDUCTION: This exam was performed according to the departmental dose-optimization program which includes automated exposure control, adjustment of the mA and/or kV according to patient size and/or use of iterative reconstruction technique. COMPARISON:  CTA head and neck dated October 10, 2023. FINDINGS: Brain: No evidence of acute infarction, hemorrhage, hydrocephalus, extra-axial collection or mass lesion/mass effect. Vascular: Atherosclerotic vascular calcification of the carotid siphons. No hyperdense vessel. Skull: Normal. Negative for fracture or focal lesion. Sinuses/Orbits: No acute finding. Other: None. IMPRESSION: 1. No acute intracranial abnormality. Electronically Signed   By: Elsie ONEIDA Shoulder M.D.   On: 12/22/2023 11:24    Procedures Procedures    Medications Ordered in ED Medications  sodium chloride  flush (NS) 0.9 % injection 3 mL (has no administration in time range)  baclofen  (LIORESAL ) tablet 20 mg (20 mg Oral Given 12/22/23 1107)  predniSONE  (DELTASONE ) tablet 60 mg (60 mg Oral Given 12/22/23 1107)    ED Course/ Medical Decision Making/ A&P  Medical Decision Making Amount and/or Complexity of Data Reviewed Labs: ordered. Radiology: ordered.  Risk Prescription drug management.   68 year old female presents to ED for evaluation.  Please see HPI for further details.  On examination patient neurological examinations at baseline.  5 out of 5 strength bilateral upper extremities.  5 out of 5 strength of bilateral lower extremities.  CN III through XII intact.  Intact finger-nose, heel-to-shin.  No pronator drift, no slurred speech or facial droop.  Patient does have a positive Tinel sign left posterior occipital region.  Patient seen day prior, diagnosed with occipital neuralgia.  Patient states that her symptoms not improved however she has not picked up her medication.  Will provide patient baclofen , prednisone .  Patient labs ordered in triage include CBC, CMP, i-STAT  Chem-8, ethanol, APTT, PT/INR, CT head.  CT head negative.  Patient will receive MR to ensure no underlying etiology to her posterior head pain.  MRI negative for any kind of acute process.  Most likely cause of patient's symptoms due to occipital neuralgia.  Will encourage patient to follow-up with PCP, pick up medication sent to her pharmacy.  Have encouraged rest at home.  Return precautions provided and she voiced understanding.  Stable to discharge home.   Final Clinical Impression(s) / ED Diagnoses Final diagnoses:  Occipital neuralgia of left side  Rx / DC Orders ED Discharge Orders     None         Ruthell Lonni JULIANNA DEVONNA 12/22/23 1339    Dean Clarity, MD 12/22/23 [phone]

## 2023-12-22 NOTE — ED Notes (Signed)
 AVS provided to and discussed with patient. Pt verbalizes understanding of discharge instructions and denies any questions or concerns at this time. Pt ambulated out of department independently with steady gait.

## 2023-12-22 NOTE — ED Triage Notes (Signed)
 Patient reports woke up around 12pm yesterday with severe pain in back of left head.  Went to UC and was dx with occipital neuralgia and prescribed prednisone  and muscle relaxers.  Patient reports pain is unbearable.  Mild left facial droop and left sided sensation difference.  No weakness noted to unilateral extremities

## 2024-02-12 ENCOUNTER — Other Ambulatory Visit: Payer: Self-pay | Admitting: Nurse Practitioner

## 2024-02-12 DIAGNOSIS — E2839 Other primary ovarian failure: Secondary | ICD-10-CM

## 2024-03-14 ENCOUNTER — Other Ambulatory Visit: Payer: Self-pay

## 2024-03-14 ENCOUNTER — Encounter (HOSPITAL_COMMUNITY): Payer: Self-pay | Admitting: *Deleted

## 2024-03-14 ENCOUNTER — Emergency Department (HOSPITAL_COMMUNITY)

## 2024-03-14 ENCOUNTER — Emergency Department (HOSPITAL_COMMUNITY)
Admission: EM | Admit: 2024-03-14 | Discharge: 2024-03-14 | Disposition: A | Discharge status: Home / Self Care | Attending: Emergency Medicine | Admitting: Emergency Medicine

## 2024-03-14 DIAGNOSIS — R109 Unspecified abdominal pain: Secondary | ICD-10-CM | POA: Insufficient documentation

## 2024-03-14 DIAGNOSIS — R10A0 Flank pain, unspecified side: Secondary | ICD-10-CM

## 2024-03-14 LAB — URINALYSIS, W/ REFLEX TO CULTURE (INFECTION SUSPECTED)
Bacteria, UA: NONE SEEN
Bilirubin Urine: NEGATIVE
Glucose, UA: NEGATIVE mg/dL
Hgb urine dipstick: NEGATIVE
Ketones, ur: NEGATIVE mg/dL
Nitrite: NEGATIVE
Protein, ur: NEGATIVE mg/dL
Specific Gravity, Urine: 1.013 (ref 1.005–1.030)
pH: 5 (ref 5.0–8.0)

## 2024-03-14 LAB — CBC WITH DIFFERENTIAL/PLATELET
Abs Immature Granulocytes: 0.01 10*3/uL (ref 0.00–0.07)
Basophils Absolute: 0.1 10*3/uL (ref 0.0–0.1)
Basophils Relative: 1 %
Eosinophils Absolute: 0.1 10*3/uL (ref 0.0–0.5)
Eosinophils Relative: 2 %
HCT: 37.7 % (ref 36.0–46.0)
Hemoglobin: 12.2 g/dL (ref 12.0–15.0)
Immature Granulocytes: 0 %
Lymphocytes Relative: 42 %
Lymphs Abs: 1.8 10*3/uL (ref 0.7–4.0)
MCH: 28 pg (ref 26.0–34.0)
MCHC: 32.4 g/dL (ref 30.0–36.0)
MCV: 86.5 fL (ref 80.0–100.0)
Monocytes Absolute: 0.4 10*3/uL (ref 0.1–1.0)
Monocytes Relative: 8 %
Neutro Abs: 2 10*3/uL (ref 1.7–7.7)
Neutrophils Relative %: 47 %
Platelets: 235 10*3/uL (ref 150–400)
RBC: 4.36 MIL/uL (ref 3.87–5.11)
RDW: 13.8 % (ref 11.5–15.5)
WBC: 4.4 10*3/uL (ref 4.0–10.5)
nRBC: 0 % (ref 0.0–0.2)

## 2024-03-14 LAB — COMPREHENSIVE METABOLIC PANEL WITH GFR
ALT: 14 U/L (ref 0–44)
AST: 16 U/L (ref 15–41)
Albumin: 3.7 g/dL (ref 3.5–5.0)
Alkaline Phosphatase: 78 U/L (ref 38–126)
Anion gap: 7 (ref 5–15)
BUN: 11 mg/dL (ref 8–23)
CO2: 28 mmol/L (ref 22–32)
Calcium: 9.6 mg/dL (ref 8.9–10.3)
Chloride: 104 mmol/L (ref 98–111)
Creatinine, Ser: 0.93 mg/dL (ref 0.44–1.00)
GFR, Estimated: >60 mL/min (ref >60)
Glucose, Bld: 86 mg/dL (ref 70–99)
Potassium: 4.1 mmol/L (ref 3.5–5.1)
Sodium: 139 mmol/L (ref 135–145)
Total Bilirubin: 0.3 mg/dL (ref 0.0–1.2)
Total Protein: 6.3 g/dL — ABNORMAL LOW (ref 6.5–8.1)

## 2024-03-14 LAB — TROPONIN I (HIGH SENSITIVITY): Troponin I (High Sensitivity): 3 ng/L (ref <18)

## 2024-03-14 LAB — LIPASE, BLOOD: Lipase: 31 U/L (ref 11–51)

## 2024-03-14 MED ORDER — LIDOCAINE 5 % EX PTCH
1.0000 | MEDICATED_PATCH | CUTANEOUS | Status: DC
Start: 2024-03-14 — End: 2024-03-15
  Administered 2024-03-14: 1 via TRANSDERMAL
  Filled 2024-03-14: qty 1

## 2024-03-14 MED ORDER — LIDOCAINE 5 % EX PTCH: 1.0000 | MEDICATED_PATCH | CUTANEOUS | 0 refills | Status: AC

## 2024-03-14 MED ORDER — IOHEXOL 350 MG/ML SOLN
75.0000 mL | Freq: Once | INTRAVENOUS | Status: AC | PRN
  Administered 2024-03-14: 75 mL via INTRAVENOUS

## 2024-03-14 MED ORDER — METHOCARBAMOL 500 MG PO TABS: 500.0000 mg | ORAL_TABLET | Freq: Two times a day (BID) | ORAL | 0 refills | Status: AC

## 2024-03-14 NOTE — ED Provider Notes (Signed)
 [place] EMERGENCY DEPARTMENT AT [facility] Provider Note   CSN: [id number] Arrival date & time: 03/14/24  1434    History  Chief Complaint  Patient presents with   Flank Pain    Kylie Hudson is a 68 y.o. female here for left flank pain. Worse with movement. Constant in nature. 2 days of urinary frequency. No fever, CP, SOB, LE swelling/ pain, hematuria, diarrhea, constipation. Prior right nephrectomy. No redness, warmth, rash to area.    HPI    Home Medications Prior to Admission medications   Medication Sig Start Date End Date Taking? Authorizing Provider  lidocaine (LIDODERM) 5 % Place 1 patch onto the skin daily. Remove & Discard patch within 12 hours or as directed by MD 03/14/24  Yes Glena Pharris A, PA-C  methocarbamol  (ROBAXIN ) 500 MG tablet Take 1 tablet (500 mg total) by mouth 2 (two) times daily. 03/14/24  Yes Armonte Tortorella A, PA-C  baclofen  (LIORESAL ) 20 MG tablet Take 1 tablet (20 mg total) by mouth at bedtime. 12/21/23   Rodriguez-Southworth, Sylvia, PA-C  loperamide (IMODIUM) 2 MG capsule Take 2 mg by mouth as needed. 08/07/23   [provider]  losartan (COZAAR) 100 MG tablet Take 100 mg by mouth daily. 06/04/23   [provider]  losartan (COZAAR) 50 MG tablet Take 1 tablet by mouth daily. 02/21/21   [provider]  metoprolol succinate (TOPROL-XL) 100 MG 24 hr tablet  06/27/20   [provider]  mirtazapine (REMERON) 30 MG tablet Take 30 mg by mouth at bedtime. 01/12/19   [provider]  naproxen (EC NAPROSYN) 500 MG EC tablet SMARTSIG:1 Tablet(s) By Mouth Every 12 Hours PRN 03/21/21   [provider]  NIFEdipine (PROCARDIA-XL/NIFEDICAL-XL) 30 MG 24 hr tablet  07/07/20   [provider]  ondansetron  (ZOFRAN -ODT) 4 MG disintegrating tablet Take 1 tablet (4 mg total) by mouth every 8 (eight) hours as needed for nausea or vomiting. 11/25/23   Jerilynn Montenegro, MD  pantoprazole  (PROTONIX ) 40 MG  tablet Take 1 tablet (40 mg total) by mouth daily. 12/11/23   Graciella Lavender, PA  predniSONE  (DELTASONE ) 20 MG tablet Take 1 tablet (20 mg total) by mouth daily with breakfast. 12/21/23   Rodriguez-Southworth, Lamond Pilot, PA-C  SUMAtriptan (IMITREX) 25 MG tablet Take 25 mg by mouth as needed. 01/28/23   [provider]      Allergies    Doxycycline and Nsaids    Review of Systems   Review of Systems  Constitutional: Negative.   HENT: Negative.    Respiratory: Negative.    Cardiovascular: Negative.   Gastrointestinal: Negative.   Genitourinary:  Positive for flank pain and frequency. Negative for difficulty urinating, dyspareunia, dysuria, enuresis, hematuria, menstrual problem, pelvic pain, urgency, vaginal bleeding and vaginal pain.  Skin: Negative.   Neurological: Negative.   All other systems reviewed and are negative.   Physical Exam Updated Vital Signs BP 133/81   Pulse 80   Temp 98.4 F (36.9 C)   Resp 18   Ht 5\' 6"  (1.676 m)   Wt 87.1 kg   SpO2 100%   BMI 30.99 kg/m  Physical Exam Vitals and nursing note reviewed.  Constitutional:      General: She is not in acute distress.    Appearance: She is well-developed. She is not ill-appearing, toxic-appearing or diaphoretic.  HENT:     Head: Atraumatic.  Eyes:     Pupils: Pupils are equal, round, and reactive to light.  Cardiovascular:  Rate and Rhythm: Normal rate.     Pulses: Normal pulses.          Radial pulses are 2+ on the right side and 2+ on the left side.       Dorsalis pedis pulses are 2+ on the right side and 2+ on the left side.     Heart sounds: Normal heart sounds.  Pulmonary:     Effort: Pulmonary effort is normal. No respiratory distress.     Breath sounds: Normal breath sounds.  Abdominal:     General: Bowel sounds are normal. There is no distension.     Palpations: Abdomen is soft.     Tenderness: There is abdominal tenderness. There is left CVA tenderness. There is no right CVA  tenderness.     Comments: Left CVA tenderness, no redness, warmth  Musculoskeletal:        General: Normal range of motion.     Cervical back: Normal range of motion.     Comments: No midline tenderness, compartments soft, full range of motion  Skin:    General: Skin is warm and dry.     Comments: No obvious rashes or lesions on exposed skin  Neurological:     General: No focal deficit present.     Mental Status: She is alert.  Psychiatric:        Mood and Affect: Mood normal.    ED Results / Procedures / Treatments   Labs (all labs ordered are listed, but only abnormal results are displayed) Labs Reviewed  URINALYSIS, W/ REFLEX TO CULTURE (INFECTION SUSPECTED) - Abnormal; Notable for the following components:      Result Value   Leukocytes,Ua TRACE (*)    All other components within normal limits  COMPREHENSIVE METABOLIC PANEL WITH GFR - Abnormal; Notable for the following components:   Total Protein 6.3 (*)    All other components within normal limits  CBC WITH DIFFERENTIAL/PLATELET  LIPASE, BLOOD  TROPONIN I (HIGH SENSITIVITY)    EKG None  Radiology CT ABDOMEN PELVIS W CONTRAST Result Date: 03/14/2024 CLINICAL DATA:  Left flank pain. EXAM: CT ABDOMEN AND PELVIS WITH CONTRAST TECHNIQUE: Multidetector CT imaging of the abdomen and pelvis was performed using the standard protocol following bolus administration of intravenous contrast. RADIATION DOSE REDUCTION: This exam was performed according to the departmental dose-optimization program which includes automated exposure control, adjustment of the mA and/or kV according to patient size and/or use of iterative reconstruction technique. CONTRAST:  75mL OMNIPAQUE  IOHEXOL  350 MG/ML SOLN COMPARISON:  CT abdomen pelvis dated 11/25/2023. FINDINGS: Lower chest: The visualized lung bases are clear. No intra-abdominal free air or free fluid. Hepatobiliary: Subcentimeter fatty hypodense lesions are too small to characterize. No biliary  dilatation. Cholecystectomy. Pancreas: Unremarkable. No pancreatic ductal dilatation or surrounding inflammatory changes. Spleen: Similar appearance of small splenic hypodense lesions. Adrenals/Urinary Tract: The adrenal glands unremarkable status post prior right nephrectomy. No suspicious tissue in the nephrectomy bed. The left kidney, visualized left ureter, and urinary bladder appear unremarkable. Stomach/Bowel: There is moderate stool throughout the colon. There is sigmoid diverticulosis and scattered colonic diverticula. There is no bowel obstruction or active inflammation. The appendix is normal. Vascular/Lymphatic: The abdominal aorta and IVC are unremarkable. No portal venous gas. There is no adenopathy. Reproductive: The uterus is anteverted. Foci of calcification in the uterine fundus likely calcified fibroids. No suspicious adnexal masses. Bilateral tubal ligation clips. Other: None Musculoskeletal: Degenerative changes of the spine. No acute osseous pathology. IMPRESSION: 1. No acute intra-abdominal  or pelvic pathology. 2. Colonic diverticulosis. No bowel obstruction. Normal appendix. 3. Prior right nephrectomy. No suspicious tissue in the nephrectomy bed. Electronically Signed   By: Angus Bark M.D.   On: 03/14/2024 18:45    Procedures Procedures    Medications Ordered in ED Medications  lidocaine (LIDODERM) 5 % 1 patch (1 patch Transdermal Patch Applied 03/14/24 1910)  iohexol  (OMNIPAQUE ) 350 MG/ML injection 75 mL (75 mLs Intravenous Contrast Given 03/14/24 1837)    ED Course/ Medical Decision Making/ A&P Here for left flank pain.  Started 2 days ago.  No known trauma or injury.  Worse with movement.  Some noted urinary frequency without dysuria or hematuria.  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  She is afebrile, nonseptic, non-ill-appearing.  She has no clinical evidence of VTE on exam.  No chest pain, shortness of breath lower extremity pain or swelling.  She is Wells  criteria low risk.  No rashes to suggest shingles as cause of her symptoms.  Will plan on labs, imaging and reassess  Labs and imaging personally viewed and interpreted:  CBC without leukocytosis UA negative for infection, blood EKG sinus rhythm Trop 31 Lipase 3 Metabolic panel wo acute abnormality CT AP without acute abnormality  Patient reassessed.  We discussed labs and imaging.  Pain reproducible on exam, worse with movement, reassuring workup.  Will treat as musculoskeletal.  Will have her follow-up outpatient, return for any worsening symptoms  Patient is nontoxic, nonseptic appearing, in no apparent distress.  Patient's pain and other symptoms adequately managed in emergency department.  Fluid bolus given.  Labs, imaging and vitals reviewed.  Patient does not meet the SIRS or Sepsis criteria.  On repeat exam patient does not have a surgical abdomin and there are no peritoneal signs.  No indication of appendicitis, bowel obstruction, bowel perforation, cholecystitis, diverticulitis, AAA, dissection, unstable angina, PE, dissection, intermittent/persistent torsion, shingles, kidney stone, PID or ectopic pregnancy.  Patient discharged home with symptomatic treatment and given strict instructions for follow-up with their primary care physician.  I have also discussed reasons to return immediately to the ER.  Patient expresses understanding and agrees with plan.                                  Medical Decision Making Amount and/or Complexity of Data Reviewed External Data Reviewed: labs, radiology, ECG and notes. Labs: ordered. Decision-making details documented in ED Course. Radiology: ordered and independent interpretation performed. Decision-making details documented in ED Course. ECG/medicine tests: ordered and independent interpretation performed. Decision-making details documented in ED Course.  Risk OTC drugs. Prescription drug management. Parenteral controlled  substances. Decision regarding hospitalization. Diagnosis or treatment significantly limited by social determinants of health.          Final Clinical Impression(s) / ED Diagnoses Final diagnoses:  Flank pain    Rx / DC Orders ED Discharge Orders          Ordered    methocarbamol  (ROBAXIN ) 500 MG tablet  2 times daily        03/14/24 1901    lidocaine (LIDODERM) 5 %  Every 24 hours        03/14/24 1901              Coady Train A, PA-C 03/14/24 1941    Almond Army, MD 03/14/24 2205

## 2024-03-14 NOTE — ED Triage Notes (Signed)
 Urinary frequency for 2 days lt flank pain since last pm no temp no bloody urine.  Kidney removed in2007 for cancer

## 2024-03-14 NOTE — Discharge Instructions (Addendum)
 It was a pleasure taking care of you here in the emergency department  Your workup today was reassuring  I have written for some medication to help with your symptom, Robaxin .  This is a muscle relaxer.  If you are also taking the baclofen  listed on your medical record please not take this medication together as they are both muscle relaxers.  I have also put in an order for lidocaine patches.  You wear these patches for 12 hours.  They are 12 hours on, 12 hours off.  Make sure to follow-up outpatient, return for any worsening symptoms

## 2024-03-14 NOTE — ED Notes (Signed)
 Patient transported to CT

## 2024-04-13 ENCOUNTER — Encounter (HOSPITAL_BASED_OUTPATIENT_CLINIC_OR_DEPARTMENT_OTHER): Payer: Self-pay | Admitting: Internal Medicine

## 2024-04-13 DIAGNOSIS — G4733 Obstructive sleep apnea (adult) (pediatric): Secondary | ICD-10-CM

## 2024-04-16 ENCOUNTER — Ambulatory Visit (HOSPITAL_BASED_OUTPATIENT_CLINIC_OR_DEPARTMENT_OTHER): Admitting: Internal Medicine

## 2024-04-16 VITALS — Ht 66.0 in | Wt 197.0 lb

## 2024-04-16 DIAGNOSIS — G4733 Obstructive sleep apnea (adult) (pediatric): Secondary | ICD-10-CM

## 2024-04-25 DIAGNOSIS — G4733 Obstructive sleep apnea (adult) (pediatric): Secondary | ICD-10-CM | POA: Diagnosis not present

## 2024-04-25 NOTE — Procedures (Signed)
 [facility] [address] [place], [place] [address] Tel: [phone]   Fax: [phone]  Titration Interpretation  Patient Name:  Kylie, Hudson Date:  04/16/2024 Referring Physician:  Veronia Goon, MD  Indications for Polysomnography The patient is a 68 year-old Female who is 5' 6 and weighs 197.0 lbs. Her BMI equals 32.0.  A full night titration treatment study was performed.  [facility] ([facility]) 07/30/23- AHI(4%) 14.3/hr, desaturation to 81%.  Medication  No Data.   Polysomnogram Data A full night polysomnogram recorded the standard physiologic parameters including EEG, EOG, EMG, EKG, nasal and oral airflow.  Respiratory parameters of chest and abdominal movements were recorded with Respiratory Inductance Plethysmography belts.  Oxygen saturation was recorded by pulse oximetry.   Sleep Architecture The total recording time of the polysomnogram was 369.5 minutes.  The total sleep time was 252.0 minutes.  The patient spent 13.7% of total sleep time in Stage N1, 86.3% in Stage N2, 0.0% in Stages N3, and 0.0% in REM.  Sleep latency was 2.9 minutes.  REM latency was - minutes.  Sleep Efficiency was 68.2%.  Wake after Sleep Onset time was 114.5 minutes.  Titration Summary The patient was titrated at pressures ranging from 5* cm/H20 with supplemental oxygen at - up to 11* cm/H20 with supplemental oxygen at -.  The last pressure used in the study was 11* cm/H20 with supplemental oxygen at -.  Respiratory Events The polysomnogram revealed a presence of 6 obstructive, - central, and - mixed apneas resulting in an Apnea index of 1.4 events per hour.  There were 24 hypopneas (>=3% desaturation and/or arousal) resulting in an Apnea\Hypopnea Index (AHI >=3% desaturation and/or arousal) of 7.1 events per hour.  There were 8 hypopneas (>=4% desaturation) resulting in an Apnea\Hypopnea Index (AHI >=4% desaturation) of 3.3 events per hour.   There were 14 Respiratory Effort Related Arousals resulting in a RERA index of 3.3 events per hour. The Respiratory Disturbance Index is 10.5 events per hour.  The snore index was - events per hour.  Mean oxygen saturation was 96.0%.  The lowest oxygen saturation during sleep was 90.0%.  Time spent <=88% oxygen saturation was 0.3 minutes (0.1%).  Limb Activity There were - limb movements recorded.  Of this total, - were classified as PLMs.  Of the PLMs, - were associated with arousals.  The Limb Movement index was - per hour while the PLM index was - per hour.  Cardiac Summary The average pulse rate was 60.3 bpm.  The minimum pulse rate was 49.0 bpm while the maximum pulse rate was 80.0 bpm.  Cardiac rhythm was normal- occasional PVC.Aaron Aas  Comment: CPAP titrated to 11 cwp. Residual AHI (4%) 13.3/hr, minimum O2 saturation 94%. Note Technician comment at end of report- Incomplete titration due to patient repeatedly waking with confusion, pulling off monitor leads.   Recommendation: Suggest autoCPAP 5-15 or return for another attempt at dedicated CPAP titration study if forgetfulness and disorientation can be managed. Patient wore a small-medium F&P Evora full face mask and  heated humidification.      Signed: Rosa College, MD Accredited Board Certified Sleep Disorders Medicine  [facility] [address] [place], [place] [address] Tel: [phone]   Fax: [phone] Titration Report  Patient Name: Kylie, Hudson Date: 04/16/2024  Date of Birth: 06-28-1956 Study Type: CPAP Titration  Age: 59 year MRN #: [id number]  Sex: Female Interpreting Physician: [employer] [id number]  Height: 5' 6 Referring Physician: Veronia Goon, MD  Weight: 197.0 lbs Recording Tech: Odella Bending RPSGT RST  BMI: 32.0 Scoring Tech: Odella Bending RPSGT RST  ESS: 9 Neck Size: 14  Mask Type FISHER & PAYKEL EVORA FULL Final Pressure: 11  Mask Size: SMALL-MEDIUM  Supplemental O2: N/A   Study Overview  Lights Off: 10:39:41 PM  Count Index  Lights On: 04:49:09 AM Awakenings: 46 11.0  Time in Bed: 369.5 min. Arousals: 87 20.7  Total Sleep Time: 252.0 min. AHI (>=3% Desat and/or Ar.): 30 7.1   Sleep Efficiency: 68.2% AHI (>=4% Desat): 14 3.3   Sleep Latency: 2.9 min. Limb Movements: - -  Wake After Sleep Onset: 114.5 min. Snore: - -  REM Latency from Sleep Onset: - min. Desaturations: 42 10.0     Minimum SpO2 TST: 90.0%    Sleep Architecture  % of Time in Bed Stages Time (mins) % Sleep Time  Wake 118.0   Stage N1 34.5 13.7%  Stage N2 217.5 86.3%  Stage N3 0.0 0.0%  REM 0.0 0.0%   Arousal Summary   NREM REM Sleep Index  Respiratory Arousals 33 - 33 7.9  PLM Arousals - - - -  Isolated Limb Movement Arousals - - - -  Snore Arousals - - - -  Spontaneous Arousals 54 - 54 12.9  Total 87 - 87 20.7   Limb Movement Summary   Count Index  Isolated Limb Movements - -  Periodic Limb Movements (PLMs) - -  Total Limb Movements - -    Respiratory Summary   By Sleep Stage By Body Position Total   NREM REM Supine Non-Supine   Time (min) 252.0 0.0 176.0 76.0 252.0         Obstructive Apnea 6 - 5 1 6   Mixed Apnea - - - - -  Central Apnea - - - - -  Total Apneas 6 - 5 1 6   Total Apnea Index 1.4 - 1.7 0.8 1.4         Hypopneas (>=3% Desat and/or Ar.) 24 - 17 7 24   AHI (>=3% Desat and/or Ar.) 7.1 - 7.5 6.3 7.1         Hypopneas (>=4% Desat) 8 - 6 2 8   AHI (>=4% Desat) 3.3 - 3.8 2.4 3.3          RERAs 14 - 13 1 14   RERA Index 3.3 - 4.4 0.8 3.3         RDI 10.5 - 11.9 7.1 10.5     Respiratory Event Durations   Apnea Hypopnea   NREM REM NREM REM  Average (seconds) 14.9 - 21.7 -  Maximum (seconds) 16.1 - 34.0 -    Oxygen Saturation Summary   Wake NREM REM TST TIB  Average SpO2 96.9% 95.6% - 95.6% 96.0%  Minimum SpO2 87.0% 90.0% - 90.0% 87.0%  Maximum SpO2 99.0% 99.0% - 99.0% 99.0%   Oxygen Saturation Distribution  Range (%)  Time in range (min) Time in range (%)  90.0 - 100.0 365.0 99.9%  80.0 - 90.0 0.5 0.1%  70.0 - 80.0 - -  60.0 - 70.0 - -  50.0 - 60.0 - -  0.0 - 50.0 - -  Time Spent <=88% SpO2  Range (%) Time in range (min) Time in range (%)  0.0 - 88.0 0.3 0.1%      Count Index  Desaturations 42 10.0    Cardiac Summary   Wake NREM REM Sleep Total  Average  Pulse Rate (BPM) 61.6 59.9 - 59.9 60.3  Minimum Pulse Rate (BPM) 49.0 49.0 - 49.0 49.0  Maximum Pulse Rate (BPM) 78.0 80.0 - 80.0 80.0   Pulse Rate Distribution:  Range (bpm) Time in range (min) Time in range (%)  0.0 - 40.0 - -  40.0 - 60.0 192.6 52.6%  60.0 - 80.0 160.8 43.9%  80.0 - 100.0 - -  100.0 - 120.0 - -  120.0 - 140.0 - -  140.0 - 200.0 - -   Titration Summary  PAP Device PAP Level O2 Level Time (min) Wake (min) NREM (min) REM (min) Sleep Eff% OA# CA# MA# Hyp# (>=3%) AHI (>=3%) Hyp# (>=4%) AHI (>=%4) RERA RDI SpO2 <=88% (min) Min SpO2 Mean SpO2 Ar. Index  CPAP 5 - 63.5 31.5 32.0 0.0 50.4% - - - 4 7.5 1  1.9 1  9.4  0.0 91.0 95.2 15.0  CPAP 6 - 18.0 7.0 11.0 0.0 61.1% - - - 2 10.9 -  - -  10.9  0.0 92.0 94.9 10.9  CPAP 7 - 62.0 8.5 53.5 0.0 86.3% 1 - - 6 7.9 2  3.4 1  9.0  0.0 90.0 95.7 17.9  CPAP 8 - 99.0 15.5 83.5 0.0 84.3% 2 - - 5 5.0 1  2.2 4  7.9  0.0 92.0 95.4 15.8  CPAP 9 - 83.5 25.0 58.5 0.0 70.1% - - - 7 7.2 4  4.1 5  12.3  0.0 91.0 95.8 26.7  CPAP 11 - 44.0 30.5 13.5 0.0 30.7% 3 - - - 13.3 -  13.3 3  26.7  0.0 94.0 97.5 57.8    Hypnograms                           Technologist Comments  THE 68-YEAR-OLD FEMALE PATIENT PRESENTED TO THE SLEEP DISORDER CENTER FOR A CPAP TITRATION STUDY WITH A CHIEF COMPLAINT OF OSA. THE PATIENT WAS FITTED WITH A SMALL-MEDIUM FISHER & PAYKEL EVORA FULL FACE MASK FOR PRE-CPAP TRIAL, WHICH WAS TOLERATED WELL. NO BEDTIME MEDICATIONS WERE SELF ADMINISTERED. THE LEAD PLACEMENT WAS INITIATED, THEN THE STUDY WAS BEGUN. THE CPAP TRIAL WAS INITIATED USING THE  ABOVE MASK TYPE ON A PRESSURE OF 5cmH2O AND WAS TITRATED TO 11cmH2O WITH AN EPR OF 2 AND HEATED HUMIDIFICATION. THE CPAP PRESSURE WAS INCREASED FOR RESPIRATORY EVENTS AND RERAs. OPTIMAL PRESSURE WASN'T ACHIEVED DUE TO THE PATIENT INABILITY TO MAINTAIN SLEEP. NO PLMs - PLMAs WERE NOTED. NO SUPPLEMENTAL OXYGEN WAS WARRENTED. NO RESTROOM VISITS WERE MADE.  QUESTIONABLE CARDIAC ARRHYTHMIAS WERE OBSERVED ON EPOCHS 52,54,259, 381, AND THROUGHOUT THE ENTIRE STUDY. NO OBVIOUS PARASOMNIAs WERE OBSERVED. THE PATIENT WOKE UP THROUGHOUT THE NIGHT CONFUSED AND IN A STATE OF FORGETFULLNESS. SHE WAS ATTEMPTING TO REMOVE HER MASK AND THE EEG LEADS ON HER HEAD.  THE TECH HAD TO GO INTO THE ROOM TO REMIND THE PATIENT WHERE SHE WAS AND ENCOURAGE HER TO TRY TO RETURN TO SLEEP. SPONTANEOUS AROUSALS WERE NOTED.   THE PATIENT STATED THAT SHE HAD A HARD TIME MAINTAINING SLEEP DUE TO HER WAKING UP AND FORGETTING WHERE SHE WAS.  THE PATIENT TOLERATED THE CPAP TRIAL WELL.                            [employer] Diplomate, Biomedical engineer of Sleep Medicine  ELECTRONICALLY SIGNED ON:  04/25/2024, 1:20 PM [facility] PH: [phone]   FX: [phone]  [phone] ACCREDITED BY THE AMERICAN ACADEMY OF SLEEP MEDICINE

## 2024-06-01 ENCOUNTER — Emergency Department (HOSPITAL_COMMUNITY)
Admission: EM | Admit: 2024-06-01 | Discharge: 2024-06-01 | Disposition: A | Discharge status: Home / Self Care | Attending: Emergency Medicine | Admitting: Emergency Medicine

## 2024-06-01 ENCOUNTER — Other Ambulatory Visit: Payer: Self-pay

## 2024-06-01 ENCOUNTER — Encounter (HOSPITAL_COMMUNITY): Payer: Self-pay

## 2024-06-01 DIAGNOSIS — M5441 Lumbago with sciatica, right side: Secondary | ICD-10-CM | POA: Insufficient documentation

## 2024-06-01 DIAGNOSIS — D72819 Decreased white blood cell count, unspecified: Secondary | ICD-10-CM | POA: Insufficient documentation

## 2024-06-01 DIAGNOSIS — Z85828 Personal history of other malignant neoplasm of skin: Secondary | ICD-10-CM | POA: Diagnosis not present

## 2024-06-01 LAB — COMPREHENSIVE METABOLIC PANEL WITH GFR
ALT: 12 U/L (ref 0–44)
AST: 19 U/L (ref 15–41)
Albumin: 3.8 g/dL (ref 3.5–5.0)
Alkaline Phosphatase: 78 U/L (ref 38–126)
Anion gap: 8 (ref 5–15)
BUN: 10 mg/dL (ref 8–23)
CO2: 24 mmol/L (ref 22–32)
Calcium: 9.4 mg/dL (ref 8.9–10.3)
Chloride: 106 mmol/L (ref 98–111)
Creatinine, Ser: 0.93 mg/dL (ref 0.44–1.00)
GFR, Estimated: >60 mL/min (ref >60)
Glucose, Bld: 95 mg/dL (ref 70–99)
Potassium: 4.2 mmol/L (ref 3.5–5.1)
Sodium: 138 mmol/L (ref 135–145)
Total Bilirubin: 0.7 mg/dL (ref 0.0–1.2)
Total Protein: 6.6 g/dL (ref 6.5–8.1)

## 2024-06-01 LAB — URINALYSIS, ROUTINE W REFLEX MICROSCOPIC
Bacteria, UA: NONE SEEN
Bilirubin Urine: NEGATIVE
Glucose, UA: NEGATIVE mg/dL
Hgb urine dipstick: NEGATIVE
Ketones, ur: NEGATIVE mg/dL
Nitrite: NEGATIVE
Protein, ur: NEGATIVE mg/dL
Specific Gravity, Urine: 1.015 (ref 1.005–1.030)
pH: 6 (ref 5.0–8.0)

## 2024-06-01 LAB — CBC
HCT: 40.7 % (ref 36.0–46.0)
Hemoglobin: 12.8 g/dL (ref 12.0–15.0)
MCH: 27.1 pg (ref 26.0–34.0)
MCHC: 31.4 g/dL (ref 30.0–36.0)
MCV: 86 fL (ref 80.0–100.0)
Platelets: 228 K/uL (ref 150–400)
RBC: 4.73 MIL/uL (ref 3.87–5.11)
RDW: 13.5 % (ref 11.5–15.5)
WBC: 3.8 K/uL — ABNORMAL LOW (ref 4.0–10.5)
nRBC: 0 % (ref 0.0–0.2)

## 2024-06-01 LAB — LIPASE, BLOOD: Lipase: 33 U/L (ref 11–51)

## 2024-06-01 MED ORDER — DEXAMETHASONE SODIUM PHOSPHATE 10 MG/ML IJ SOLN
10.0000 mg | Freq: Once | INTRAMUSCULAR | Status: AC
  Administered 2024-06-01: 10 mg via INTRAMUSCULAR
  Filled 2024-06-01: qty 1

## 2024-06-01 MED ORDER — OXYCODONE HCL 5 MG PO TABS: 5.0000 mg | ORAL_TABLET | Freq: Every evening | ORAL | 0 refills | Status: AC | PRN

## 2024-06-01 MED ORDER — PREDNISONE 10 MG PO TABS: 40.0000 mg | ORAL_TABLET | Freq: Every day | ORAL | 0 refills | Status: AC

## 2024-06-01 MED ORDER — KETOROLAC TROMETHAMINE 30 MG/ML IJ SOLN
30.0000 mg | Freq: Once | INTRAMUSCULAR | Status: AC
  Administered 2024-06-01: 30 mg via INTRAMUSCULAR
  Filled 2024-06-01: qty 1

## 2024-06-01 MED ORDER — IBUPROFEN 400 MG PO TABS: 400.0000 mg | ORAL_TABLET | Freq: Two times a day (BID) | ORAL | 0 refills | Status: AC | PRN

## 2024-06-01 MED ORDER — OXYCODONE-ACETAMINOPHEN 5-325 MG PO TABS
1.0000 | ORAL_TABLET | ORAL | Status: DC | PRN
  Administered 2024-06-01: 1 via ORAL
  Filled 2024-06-01: qty 1

## 2024-06-01 NOTE — ED Triage Notes (Signed)
 Pt c/o right flank pain, states she feels like it may be her sciatica, is very similar to when she had previously. Pain does not radiate, pain is where kidney was removed d/t kidney cancer.

## 2024-06-01 NOTE — ED Triage Notes (Signed)
 C/o back pain going down her right hip , states she has chronic bac k pain.

## 2024-06-01 NOTE — ED Provider Notes (Signed)
 [place] EMERGENCY DEPARTMENT AT [facility] Provider Note   CSN: [id number] Arrival date & time: 06/01/24  1104     Patient presents with: Back Pain   Kylie Hudson is a 68 y.o. female here with right lower back pain.  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  Reports right low back pain onset last night, worse overnight, worse with walking.  Hx of sciatica on the same side but was worried due to her kidney history.  Denies hx of diabetes.  Prescribe muscle relaxers in the past; told to avoid NSAIDS due to single kidney  Seen in ED 2 months ago 03/14/24 for left flank pain, no emergent abdominal findings on CT imaging per by review  {Add pertinent medical, surgical, social history, OB history to HPI:32947} HPI     Prior to Admission medications   Medication Sig Start Date End Date Taking? Authorizing Provider  baclofen  (LIORESAL ) 20 MG tablet Take 1 tablet (20 mg total) by mouth at bedtime. 12/21/23   Rodriguez-Southworth, Sylvia, PA-C  lidocaine  (LIDODERM ) 5 % Place 1 patch onto the skin daily. Remove & Discard patch within 12 hours or as directed by MD 03/14/24   Henderly, Britni A, PA-C  loperamide (IMODIUM) 2 MG capsule Take 2 mg by mouth as needed. 08/07/23   [provider]  losartan (COZAAR) 100 MG tablet Take 100 mg by mouth daily. 06/04/23   [provider]  losartan (COZAAR) 50 MG tablet Take 1 tablet by mouth daily. 02/21/21   [provider]  methocarbamol  (ROBAXIN ) 500 MG tablet Take 1 tablet (500 mg total) by mouth 2 (two) times daily. 03/14/24   Henderly, Britni A, PA-C  metoprolol succinate (TOPROL-XL) 100 MG 24 hr tablet  06/27/20   [provider]  mirtazapine (REMERON) 30 MG tablet Take 30 mg by mouth at bedtime. 01/12/19   [provider]  naproxen (EC NAPROSYN) 500 MG EC tablet SMARTSIG:1 Tablet(s) By Mouth Every 12 Hours PRN 03/21/21   [provider]  NIFEdipine (PROCARDIA-XL/NIFEDICAL-XL) 30 MG 24 hr  tablet  07/07/20   [provider]  ondansetron  (ZOFRAN -ODT) 4 MG disintegrating tablet Take 1 tablet (4 mg total) by mouth every 8 (eight) hours as needed for nausea or vomiting. 11/25/23   Freddi Hamilton, MD  pantoprazole  (PROTONIX ) 40 MG tablet Take 1 tablet (40 mg total) by mouth daily. 12/11/23   Beather Delon Gibson, PA  predniSONE  (DELTASONE ) 20 MG tablet Take 1 tablet (20 mg total) by mouth daily with breakfast. 12/21/23   Rodriguez-Southworth, Kyra, PA-C  SUMAtriptan (IMITREX) 25 MG tablet Take 25 mg by mouth as needed. 01/28/23   [provider]    Allergies: Doxycycline and Nsaids    Review of Systems  Updated Vital Signs BP (!) 146/84 (BP Location: Right Arm)   Pulse 80   Temp 98 F (36.7 C)   Resp (!) 23   Ht 5' 6 (1.676 m)   Wt 99.8 kg   SpO2 98%   BMI 35.51 kg/m   Physical Exam Constitutional:      General: She is not in acute distress. HENT:     Head: Normocephalic and atraumatic.  Eyes:     Conjunctiva/sclera: Conjunctivae normal.     Pupils: Pupils are equal, round, and reactive to light.  Cardiovascular:     Rate and Rhythm: Normal rate and regular rhythm.  Pulmonary:     Effort: Pulmonary effort is normal. No respiratory distress.  Abdominal:  General: There is no distension.     Tenderness: There is no abdominal tenderness. There is no right CVA tenderness or left CVA tenderness.  Musculoskeletal:        General: No swelling, deformity or signs of injury. Normal range of motion.     Comments: Tenderness near rim of right posterior pelvis  Skin:    General: Skin is warm and dry.  Neurological:     General: No focal deficit present.     Mental Status: She is alert and oriented to person, place, and time. Mental status is at baseline.  Psychiatric:        Mood and Affect: Mood normal.        Behavior: Behavior normal.     (all labs ordered are listed, but only abnormal results are displayed) Labs Reviewed  CBC - Abnormal;  Notable for the following components:      Result Value   WBC 3.8 (*)    All other components within normal limits  URINALYSIS, ROUTINE W REFLEX MICROSCOPIC - Abnormal; Notable for the following components:   Leukocytes,Ua SMALL (*)    All other components within normal limits  LIPASE, BLOOD  COMPREHENSIVE METABOLIC PANEL WITH GFR    EKG: None  Radiology: No results found.  {Document cardiac monitor, telemetry assessment procedure when appropriate:32947} Procedures   Medications Ordered in the ED  oxyCODONE -acetaminophen  (PERCOCET/ROXICET) 5-325 MG per tablet 1 tablet (1 tablet Oral Given 06/01/24 1123)      {Click here for ABCD2, HEART and other calculators REFRESH Note before signing:1}                              Medical Decision Making Amount and/or Complexity of Data Reviewed Labs: ordered.  Risk Prescription drug management.   Pt here with lower posterior pelvis pain consistent with sciatica vs muscular strain/lumbar strain vs piriformis strain vs radiculopathy vs other MSK cause  I doubt intraabdominal organ infection or UTI clinically  UA and labs reviewed personally, notable for ***  Pt given IM steroids and IM toradol  for pain.  I think a short course of lower-dose NSAIDS at 400 mg Q12 would be reasonable for the next 3-4 days given her kidney function.  She already takes tylenol .  We can also do another 4 days of steroids prednisone  beginning tomorrow.  The patient is comfortable with this plan.  No indication for further imaging or testing in the ED.  {Document critical care time when appropriate  Document review of labs and clinical decision tools ie CHADS2VASC2, etc  Document your independent review of radiology images and any outside records  Document your discussion with family members, caretakers and with consultants  Document social determinants of health affecting pt's care  Document your decision making why or why not admission, treatments were  needed:32947:::1}   Final diagnoses:  None    ED Discharge Orders     None

## 2024-06-13 ENCOUNTER — Encounter (HOSPITAL_COMMUNITY): Payer: Self-pay | Admitting: *Deleted

## 2024-06-13 ENCOUNTER — Other Ambulatory Visit: Payer: Self-pay

## 2024-06-13 ENCOUNTER — Emergency Department (HOSPITAL_COMMUNITY)
Admission: EM | Admit: 2024-06-13 | Discharge: 2024-06-13 | Disposition: A | Discharge status: Home / Self Care | Attending: Emergency Medicine | Admitting: Emergency Medicine

## 2024-06-13 DIAGNOSIS — R197 Diarrhea, unspecified: Secondary | ICD-10-CM | POA: Insufficient documentation

## 2024-06-13 LAB — CBC
HCT: 44.6 % (ref 36.0–46.0)
Hemoglobin: 14.2 g/dL (ref 12.0–15.0)
MCH: 27.4 pg (ref 26.0–34.0)
MCHC: 31.8 g/dL (ref 30.0–36.0)
MCV: 85.9 fL (ref 80.0–100.0)
Platelets: 241 K/uL (ref 150–400)
RBC: 5.19 MIL/uL — ABNORMAL HIGH (ref 3.87–5.11)
RDW: 13.5 % (ref 11.5–15.5)
WBC: 5 K/uL (ref 4.0–10.5)
nRBC: 0 % (ref 0.0–0.2)

## 2024-06-13 LAB — URINALYSIS, ROUTINE W REFLEX MICROSCOPIC
Bilirubin Urine: NEGATIVE
Glucose, UA: NEGATIVE mg/dL
Ketones, ur: NEGATIVE mg/dL
Leukocytes,Ua: NEGATIVE
Nitrite: NEGATIVE
Protein, ur: NEGATIVE mg/dL
Specific Gravity, Urine: 1.017 (ref 1.005–1.030)
pH: 6 (ref 5.0–8.0)

## 2024-06-13 LAB — LIPASE, BLOOD: Lipase: 37 U/L (ref 11–51)

## 2024-06-13 LAB — COMPREHENSIVE METABOLIC PANEL WITH GFR
ALT: 15 U/L (ref 0–44)
AST: 15 U/L (ref 15–41)
Albumin: 3.9 g/dL (ref 3.5–5.0)
Alkaline Phosphatase: 84 U/L (ref 38–126)
Anion gap: 10 (ref 5–15)
BUN: 13 mg/dL (ref 8–23)
CO2: 26 mmol/L (ref 22–32)
Calcium: 9.6 mg/dL (ref 8.9–10.3)
Chloride: 104 mmol/L (ref 98–111)
Creatinine, Ser: 0.99 mg/dL (ref 0.44–1.00)
GFR, Estimated: >60 mL/min (ref >60)
Glucose, Bld: 101 mg/dL — ABNORMAL HIGH (ref 70–99)
Potassium: 4.2 mmol/L (ref 3.5–5.1)
Sodium: 140 mmol/L (ref 135–145)
Total Bilirubin: 0.4 mg/dL (ref 0.0–1.2)
Total Protein: 7 g/dL (ref 6.5–8.1)

## 2024-06-13 LAB — RESP PANEL BY RT-PCR (RSV, FLU A&B, COVID)  RVPGX2
Influenza A by PCR: NEGATIVE
Influenza B by PCR: NEGATIVE
Resp Syncytial Virus by PCR: NEGATIVE
SARS Coronavirus 2 by RT PCR: NEGATIVE

## 2024-06-13 MED ORDER — LOPERAMIDE HCL 2 MG PO CAPS
2.0000 mg | ORAL_CAPSULE | Freq: Four times a day (QID) | ORAL | 0 refills | Status: AC | PRN
Start: 2024-06-13 — End: ?

## 2024-06-13 NOTE — ED Triage Notes (Signed)
 The pt has bad pain with diarrhea since yesterday  she wants a covid test    no temp

## 2024-06-13 NOTE — ED Provider Notes (Signed)
 [place] EMERGENCY DEPARTMENT AT [facility] Provider Note   CSN: [id number] Arrival date & time: 06/13/24  2012     Patient presents with: Abdominal Pain   Kylie Hudson is a 68 y.o. female.  Patient with past history significant for prior nephrectomy presents ED with concerns of diarrhea.  Reports she developed diarrhea starting yesterday evening with multiple episodes throughout the course of the day.  Reports she is tolerating p.o. without difficulty.  No reported nausea.  Denies any fever, chills, or bodyaches.  No sick contacts as far she is aware.  No recent antibiotic use.  Denies changes to any of her home medications.   Abdominal Pain Associated symptoms: diarrhea        Prior to Admission medications   Medication Sig Start Date End Date Taking? Authorizing Provider  loperamide  (IMODIUM ) 2 MG capsule Take 1 capsule (2 mg total) by mouth 4 (four) times daily as needed for diarrhea or loose stools. 06/13/24  Yes Nivan Melendrez A, PA-C  baclofen  (LIORESAL ) 20 MG tablet Take 1 tablet (20 mg total) by mouth at bedtime. 12/21/23   Rodriguez-Southworth, Sylvia, PA-C  ibuprofen  (ADVIL ) 400 MG tablet Take 1 tablet (400 mg total) by mouth every 12 (twelve) hours as needed for up to 5 doses. 06/01/24   Cottie Donnice PARAS, MD  lidocaine  (LIDODERM ) 5 % Place 1 patch onto the skin daily. Remove & Discard patch within 12 hours or as directed by MD 03/14/24   Henderly, Britni A, PA-C  losartan (COZAAR) 100 MG tablet Take 100 mg by mouth daily. 06/04/23   [provider]  losartan (COZAAR) 50 MG tablet Take 1 tablet by mouth daily. 02/21/21   [provider]  methocarbamol  (ROBAXIN ) 500 MG tablet Take 1 tablet (500 mg total) by mouth 2 (two) times daily. 03/14/24   Henderly, Britni A, PA-C  metoprolol succinate (TOPROL-XL) 100 MG 24 hr tablet  06/27/20   [provider]  mirtazapine (REMERON) 30 MG tablet Take 30 mg by mouth at bedtime. 01/12/19   [provider]  naproxen (EC NAPROSYN) 500 MG EC tablet SMARTSIG:1 Tablet(s) By Mouth Every 12 Hours PRN 03/21/21   [provider]  NIFEdipine (PROCARDIA-XL/NIFEDICAL-XL) 30 MG 24 hr tablet  07/07/20   [provider]  ondansetron  (ZOFRAN -ODT) 4 MG disintegrating tablet Take 1 tablet (4 mg total) by mouth every 8 (eight) hours as needed for nausea or vomiting. 11/25/23   Freddi Hamilton, MD  oxyCODONE  (ROXICODONE ) 5 MG immediate release tablet Take 1 tablet (5 mg total) by mouth at bedtime as needed for up to 3 doses for severe pain (pain score 7-10). 06/01/24   Cottie Donnice PARAS, MD  pantoprazole  (PROTONIX ) 40 MG tablet Take 1 tablet (40 mg total) by mouth daily. 12/11/23   Beather Delon Gibson, PA  predniSONE  (DELTASONE ) 20 MG tablet Take 1 tablet (20 mg total) by mouth daily with breakfast. 12/21/23   Rodriguez-Southworth, Kyra, PA-C  SUMAtriptan (IMITREX) 25 MG tablet Take 25 mg by mouth as needed. 01/28/23   [provider]    Allergies: Doxycycline and Nsaids    Review of Systems  Gastrointestinal:  Positive for abdominal pain and diarrhea.  All other systems reviewed and are negative.   Updated Vital Signs BP (!) 140/79   Pulse (!) 47   Temp 97.7 F (36.5 C) (Oral)   Resp 18   Ht 5' 6 (1.676 m)   Wt 99.8 kg   SpO2 100%   BMI  35.51 kg/m   Physical Exam Vitals and nursing note reviewed.  Constitutional:      General: She is not in acute distress.    Appearance: She is well-developed.  HENT:     Head: Normocephalic and atraumatic.  Eyes:     Conjunctiva/sclera: Conjunctivae normal.  Cardiovascular:     Rate and Rhythm: Normal rate and regular rhythm.     Heart sounds: No murmur heard. Pulmonary:     Effort: Pulmonary effort is normal. No respiratory distress.     Breath sounds: Normal breath sounds.  Abdominal:     General: Bowel sounds are normal.     Palpations: Abdomen is soft.     Tenderness: There is no abdominal tenderness.  Musculoskeletal:         General: No swelling.     Cervical back: Neck supple.  Skin:    General: Skin is warm and dry.     Capillary Refill: Capillary refill takes less than 2 seconds.  Neurological:     Mental Status: She is alert.  Psychiatric:        Mood and Affect: Mood normal.     (all labs ordered are listed, but only abnormal results are displayed) Labs Reviewed  COMPREHENSIVE METABOLIC PANEL WITH GFR - Abnormal; Notable for the following components:      Result Value   Glucose, Bld 101 (*)    All other components within normal limits  CBC - Abnormal; Notable for the following components:   RBC 5.19 (*)    All other components within normal limits  URINALYSIS, ROUTINE W REFLEX MICROSCOPIC - Abnormal; Notable for the following components:   Hgb urine dipstick SMALL (*)    Bacteria, UA RARE (*)    All other components within normal limits  RESP PANEL BY RT-PCR (RSV, FLU A&B, COVID)  [id number]  LIPASE, BLOOD    EKG: None  Radiology: No results found.   Procedures   Medications Ordered in the ED - No data to display                                  Medical Decision Making Amount and/or Complexity of Data Reviewed Labs: ordered.   This patient presents to the ED for concern of diarrhea, this involves an extensive number of treatment options, and is a complaint that carries with it a high risk of complications and morbidity.  The differential diagnosis includes gastroenteritis, medication  reaction, dehydration, AKI, bowel obstruction   Co morbidities that complicate the patient evaluation  OSA, migraines, gastritis, nephrectomy   Lab Tests:  I Ordered, and personally interpreted labs.  The pertinent results include:  CBC unremarkable, CMP without signs of AKI or dehydration, UA without signs of infection, respiratory panel negative   Consultations Obtained:  I requested consultation with none,  and discussed lab and imaging findings as well as pertinent plan - they  recommend: N/A   Problem List / ED Course / Critical interventions / Medication management  Patient with past history significant for OSA, migraines, gastritis, nephrectomy presents emergency department with concerns of abdominal pain and diarrhea.  Reports that she has had multiple episodes of diarrhea in the last day and a half.  Denies any vomiting.  No reported fever, chills or bodyaches.  States that she was concerned about dehydration and presented here due to her prior nephrectomy.  Endorses stable urine output. On exam, patient is well  appearing with no focal abdominal tenderness. Capillary refill <2 seconds and membranes are moist. No abnormal heart or lung sounds. Basic labs are unremarkable for any acute or concerning findings. Will add on respiratory panel for evaluation of possible COVID component given history of similar symptoms in setting of COVID infection and patient request. Respiratory panel negative. Suspect likely viral source of diarrhea at this time and encouraged use of Imodium  in the interim to reduce the risk of dehydration from profound GI loss. Advised strict return precautions. Discharged home in stable condition. I have reviewed the patients home medicines and have made adjustments as needed   Social Determinants of Health:  Solitary kidney   Test / Admission - Considered:  Considered admission but stable for outpatient follow up.  Final diagnoses:  Diarrhea, unspecified type    ED Discharge Orders          Ordered    loperamide  (IMODIUM ) 2 MG capsule  4 times daily PRN        06/13/24 2330               Lineth Thielke A, PA-C 06/13/24 2334    Patt Alm Macho, MD 06/14/24 1459

## 2024-06-13 NOTE — Discharge Instructions (Signed)
 You were seen in the ER today for concerns of diarrhea. Your labs and imaging were thankfully reassuring today and I suspect you have caught a stomach bug from someone around you causing the diarrhea you have noticed. Try your best to stay hydrated. Take Imodium  for diarrhea if this persist. For any concerns of new or worsening symptoms, return to the ER.

## 2024-06-16 NOTE — Procedures (Signed)
 Orders only

## 2024-07-29 ENCOUNTER — Other Ambulatory Visit: Payer: Self-pay | Admitting: *Deleted

## 2024-07-29 MED ORDER — PANTOPRAZOLE SODIUM 40 MG PO TBEC: 40.0000 mg | DELAYED_RELEASE_TABLET | Freq: Every day | ORAL | 5 refills | Status: AC

## 2024-08-10 ENCOUNTER — Other Ambulatory Visit (HOSPITAL_COMMUNITY): Payer: Self-pay | Admitting: Neurology

## 2024-08-10 DIAGNOSIS — F0390 Unspecified dementia without behavioral disturbance: Secondary | ICD-10-CM

## 2024-08-12 ENCOUNTER — Encounter (HOSPITAL_COMMUNITY)
Admission: RE | Admit: 2024-08-12 | Discharge: 2024-08-12 | Disposition: A | Discharge status: Home / Self Care | Source: Ambulatory Visit | Attending: Neurology | Admitting: Neurology

## 2024-08-12 DIAGNOSIS — F0390 Unspecified dementia without behavioral disturbance: Secondary | ICD-10-CM | POA: Diagnosis present

## 2024-08-12 LAB — GLUCOSE, CAPILLARY: Glucose-Capillary: 100 mg/dL — ABNORMAL HIGH (ref 70–99)

## 2024-08-12 MED ORDER — FLUDEOXYGLUCOSE F - 18 (FDG) INJECTION
10.0000 | Freq: Once | INTRAVENOUS | Status: AC
  Administered 2024-08-12: 9.93 via INTRAVENOUS

## 2024-09-01 ENCOUNTER — Encounter (HOSPITAL_COMMUNITY): Payer: Self-pay

## 2024-09-01 ENCOUNTER — Ambulatory Visit (HOSPITAL_COMMUNITY)
Admission: RE | Admit: 2024-09-01 | Discharge: 2024-09-01 | Disposition: A | Discharge status: Home / Self Care | Source: Ambulatory Visit | Attending: Family Medicine | Admitting: Family Medicine

## 2024-09-01 VITALS — BP 165/101 | HR 75 | Temp 97.8°F | Resp 16

## 2024-09-01 DIAGNOSIS — Z7952 Long term (current) use of systemic steroids: Secondary | ICD-10-CM | POA: Insufficient documentation

## 2024-09-01 DIAGNOSIS — R1110 Vomiting, unspecified: Secondary | ICD-10-CM | POA: Diagnosis not present

## 2024-09-01 DIAGNOSIS — Z79899 Other long term (current) drug therapy: Secondary | ICD-10-CM | POA: Diagnosis not present

## 2024-09-01 DIAGNOSIS — R42 Dizziness and giddiness: Secondary | ICD-10-CM | POA: Insufficient documentation

## 2024-09-01 DIAGNOSIS — R109 Unspecified abdominal pain: Secondary | ICD-10-CM | POA: Insufficient documentation

## 2024-09-01 DIAGNOSIS — R197 Diarrhea, unspecified: Secondary | ICD-10-CM | POA: Diagnosis not present

## 2024-09-01 LAB — COMPREHENSIVE METABOLIC PANEL WITH GFR
ALT: 16 U/L (ref 0–44)
AST: 23 U/L (ref 15–41)
Albumin: 4.2 g/dL (ref 3.5–5.0)
Alkaline Phosphatase: 92 U/L (ref 38–126)
Anion gap: 12 (ref 5–15)
BUN: 7 mg/dL — ABNORMAL LOW (ref 8–23)
CO2: 25 mmol/L (ref 22–32)
Calcium: 9.7 mg/dL (ref 8.9–10.3)
Chloride: 105 mmol/L (ref 98–111)
Creatinine, Ser: 1 mg/dL (ref 0.44–1.00)
GFR, Estimated: >60 mL/min (ref >60)
Glucose, Bld: 67 mg/dL — ABNORMAL LOW (ref 70–99)
Potassium: 3.6 mmol/L (ref 3.5–5.1)
Sodium: 142 mmol/L (ref 135–145)
Total Bilirubin: 0.8 mg/dL (ref 0.0–1.2)
Total Protein: 7.6 g/dL (ref 6.5–8.1)

## 2024-09-01 LAB — CBC WITH DIFFERENTIAL/PLATELET
Abs Immature Granulocytes: 0.01 K/uL (ref 0.00–0.07)
Basophils Absolute: 0.1 K/uL (ref 0.0–0.1)
Basophils Relative: 1 %
Eosinophils Absolute: 0.1 K/uL (ref 0.0–0.5)
Eosinophils Relative: 1 %
HCT: 43.8 % (ref 36.0–46.0)
Hemoglobin: 14.2 g/dL (ref 12.0–15.0)
Immature Granulocytes: 0 %
Lymphocytes Relative: 44 %
Lymphs Abs: 2.1 K/uL (ref 0.7–4.0)
MCH: 27.4 pg (ref 26.0–34.0)
MCHC: 32.4 g/dL (ref 30.0–36.0)
MCV: 84.6 fL (ref 80.0–100.0)
Monocytes Absolute: 0.3 K/uL (ref 0.1–1.0)
Monocytes Relative: 7 %
Neutro Abs: 2.2 K/uL (ref 1.7–7.7)
Neutrophils Relative %: 47 %
Platelets: 267 K/uL (ref 150–400)
RBC: 5.18 MIL/uL — ABNORMAL HIGH (ref 3.87–5.11)
RDW: 14 % (ref 11.5–15.5)
WBC: 4.8 K/uL (ref 4.0–10.5)
nRBC: 0 % (ref 0.0–0.2)

## 2024-09-01 LAB — POCT FASTING CBG KUC MANUAL ENTRY: POCT Glucose (KUC): 83 mg/dL (ref 70–99)

## 2024-09-01 LAB — LIPASE, BLOOD: Lipase: 29 U/L (ref 11–51)

## 2024-09-01 NOTE — Discharge Instructions (Addendum)
 You were seen today for diarrhea and abdominal pain for 10 days.  I have ordered blood work and stool testing.  The nurse will call you with any abnormal or concerning results.  In the mean time I recommend you increase your fluids to avoid dehydration. If you have worsening pain, or develop fever, chills, vomiting, or blood in your stool, then please go to the ER for further evaluation.

## 2024-09-01 NOTE — ED Triage Notes (Signed)
 Pt states she has vomiting, diarrhea and abdominal cramping X 4 days. She has been taking imodium  as needed. She ate last on Saturday, she is now feeling a little dizzy.

## 2024-09-01 NOTE — ED Provider Notes (Addendum)
 [facility]    CSN: [id number] Arrival date & time: 09/01/24  1451      History   Chief Complaint Chief Complaint  Patient presents with   Abdominal Pain    Stomach cramping growling, diarrhea - Entered by patient   Diarrhea   Emesis    HPI Kylie Hudson is a 68 y.o. female.    Abdominal Pain Associated symptoms: diarrhea and vomiting   Diarrhea Associated symptoms: abdominal pain and vomiting   Emesis Associated symptoms: abdominal pain and diarrhea    Patient is here for abdominal pain, diarrhea x 10 days.   She then ate some soup, and it worsened.  Pain is located at both flankls/sides and into the mid abdomen. Pain is 8/10.    No fevers/chills.  She has to pad herself at night in case she cannot make it to the bathroom in time.  It is just all liquid stool.   No blood in the stool.   She did have n/v x 1 day, but that resolved.  She is having 4 Bms/day.  If she drinks more, she does have more.  She has not had any fluids today b/c she wanted to make it to the doctor.   No recent travel.   No recent abx use.        Past Medical History:  Diagnosis Date   Gastritis    Kidney disease    Migraine     Patient Active Problem List   Diagnosis Date Noted   OSA (obstructive sleep apnea) 04/16/2024    Past Surgical History:  Procedure Laterality Date   CESAREAN SECTION     CHOLECYSTECTOMY     NEPHRECTOMY     Pt can not remmeebr which kidney was removed.     OB History     Gravida  2   Para      Term      Preterm      AB  0   Living  2      SAB      IAB      Ectopic      Multiple      Live Births               Home Medications    Prior to Admission medications   Medication Sig Start Date End Date Taking? Authorizing Provider  loperamide  (IMODIUM ) 2 MG capsule Take 1 capsule (2 mg total) by mouth 4 (four) times daily as needed for diarrhea or loose stools. 06/13/24  Yes Zelaya, Oscar A, PA-C  metoprolol  succinate (TOPROL-XL) 100 MG 24 hr tablet  06/27/20  Yes [provider]  mirtazapine (REMERON) 30 MG tablet Take 30 mg by mouth at bedtime. 01/12/19  Yes [provider]  NIFEdipine (PROCARDIA-XL/NIFEDICAL-XL) 30 MG 24 hr tablet  07/07/20  Yes [provider]  baclofen  (LIORESAL ) 20 MG tablet Take 1 tablet (20 mg total) by mouth at bedtime. 12/21/23   Rodriguez-Southworth, Sylvia, PA-C  ibuprofen  (ADVIL ) 400 MG tablet Take 1 tablet (400 mg total) by mouth every 12 (twelve) hours as needed for up to 5 doses. 06/01/24   Cottie Donnice PARAS, MD  lidocaine  (LIDODERM ) 5 % Place 1 patch onto the skin daily. Remove & Discard patch within 12 hours or as directed by MD 03/14/24   Henderly, Britni A, PA-C  losartan (COZAAR) 100 MG tablet Take 100 mg by mouth daily. 06/04/23   [provider]  losartan (COZAAR) 50 MG  tablet Take 1 tablet by mouth daily. 02/21/21   [provider]  methocarbamol  (ROBAXIN ) 500 MG tablet Take 1 tablet (500 mg total) by mouth 2 (two) times daily. 03/14/24   Henderly, Britni A, PA-C  naproxen (EC NAPROSYN) 500 MG EC tablet SMARTSIG:1 Tablet(s) By Mouth Every 12 Hours PRN 03/21/21   [provider]  ondansetron  (ZOFRAN -ODT) 4 MG disintegrating tablet Take 1 tablet (4 mg total) by mouth every 8 (eight) hours as needed for nausea or vomiting. 11/25/23   Freddi Hamilton, MD  oxyCODONE  (ROXICODONE ) 5 MG immediate release tablet Take 1 tablet (5 mg total) by mouth at bedtime as needed for up to 3 doses for severe pain (pain score 7-10). 06/01/24   Cottie Donnice PARAS, MD  pantoprazole  (PROTONIX ) 40 MG tablet Take 1 tablet (40 mg total) by mouth daily. 07/29/24   Beather Delon Gibson, PA  predniSONE  (DELTASONE ) 20 MG tablet Take 1 tablet (20 mg total) by mouth daily with breakfast. 12/21/23   Rodriguez-Southworth, Kyra, PA-C  SUMAtriptan (IMITREX) 25 MG tablet Take 25 mg by mouth as needed. 01/28/23   [provider]    Family History Family  History  Problem Relation Age of Onset   Inflammatory bowel disease Son     Social History Social History   Tobacco Use   Smoking status: Never   Smokeless tobacco: Never  Vaping Use   Vaping status: Never Used  Substance Use Topics   Alcohol use: Never   Drug use: Never     Allergies   Doxycycline and Nsaids   Review of Systems Review of Systems  Constitutional: Negative.   HENT: Negative.    Respiratory: Negative.    Cardiovascular: Negative.   Gastrointestinal:  Positive for abdominal pain, diarrhea and vomiting.  Genitourinary: Negative.   Musculoskeletal: Negative.   Skin: Negative.   Psychiatric/Behavioral: Negative.       Physical Exam Triage Vital Signs ED Triage Vitals  Encounter Vitals Group     BP 09/01/24 1509 (!) 165/101     Girls Systolic BP Percentile --      Girls Diastolic BP Percentile --      Boys Systolic BP Percentile --      Boys Diastolic BP Percentile --      Pulse Rate 09/01/24 1509 75     Resp 09/01/24 1509 16     Temp 09/01/24 1509 97.8 F (36.6 C)     Temp Source 09/01/24 1509 Oral     SpO2 09/01/24 1509 98 %     Weight --      Height --      Head Circumference --      Peak Flow --      Pain Score 09/01/24 1507 9     Pain Loc --      Pain Education --      Exclude from Growth Chart --    No data found.  Updated Vital Signs BP (!) 165/101 (BP Location: Left Arm)   Pulse 75   Temp 97.8 F (36.6 C) (Oral)   Resp 16   SpO2 98%   Visual Acuity Right Eye Distance:   Left Eye Distance:   Bilateral Distance:    Right Eye Near:   Left Eye Near:    Bilateral Near:     Physical Exam Constitutional:      General: She is not in acute distress.    Appearance: She is well-developed and normal weight. She is not ill-appearing or toxic-appearing.  HENT:     Mouth/Throat:     Mouth: Mucous membranes are moist.  Cardiovascular:     Rate and Rhythm: Normal rate.  Pulmonary:     Effort: Pulmonary effort is normal.      Breath sounds: Normal breath sounds.  Abdominal:     General: Bowel sounds are normal.     Palpations: Abdomen is soft.     Tenderness: There is abdominal tenderness in the right upper quadrant, epigastric area and left upper quadrant. There is no right CVA tenderness, left CVA tenderness, guarding or rebound.  Neurological:     Mental Status: She is alert.      UC Treatments / Results  Labs (all labs ordered are listed, but only abnormal results are displayed) Labs Reviewed  GASTROINTESTINAL PANEL BY PCR, STOOL (REPLACES STOOL CULTURE)  C DIFFICILE QUICK SCREEN W PCR REFLEX    COMPREHENSIVE METABOLIC PANEL WITH GFR  CBC WITH DIFFERENTIAL/PLATELET  LIPASE, BLOOD    EKG   Radiology No results found.  Procedures Procedures (including critical care time)  Medications Ordered in UC Medications - No data to display  Initial Impression / Assessment and Plan / UC Course  I have reviewed the triage vital signs and the nursing notes.  Pertinent labs & imaging results that were available during my care of the patient were reviewed by me and considered in my medical decision making (see chart for details).  Of note, patient was able to give a stool sample while here.  After the sample, she felt a bit dizzy.  She thinks it is b/c she has not had anything to eat/drink at this time.  She did drive, and states she does feel safe enough to drive.  BS was 83 today.   She was d/c home, aware to go to the ER if needed.   Final Clinical Impressions(s) / UC Diagnoses   Final diagnoses:  Abdominal pain, unspecified abdominal location  Diarrhea, unspecified type     Discharge Instructions      You were seen today for diarrhea and abdominal pain for 10 days.  I have ordered blood work and stool testing.  The nurse will call you with any abnormal or concerning results.  In the mean time I recommend you increase your fluids to avoid dehydration. If you have worsening pain, or develop  fever, chills, vomiting, or blood in your stool, then please go to the ER for further evaluation.     ED Prescriptions   None    PDMP not reviewed this encounter.   Darral Longs, MD 09/01/24 1538    Darral Longs, MD 09/01/24 [phone]

## 2024-09-02 ENCOUNTER — Ambulatory Visit (HOSPITAL_COMMUNITY): Payer: Self-pay

## 2024-09-02 ENCOUNTER — Telehealth

## 2024-09-02 ENCOUNTER — Other Ambulatory Visit: Payer: Self-pay

## 2024-09-02 ENCOUNTER — Encounter (HOSPITAL_COMMUNITY): Payer: Self-pay

## 2024-09-02 ENCOUNTER — Emergency Department (HOSPITAL_COMMUNITY)

## 2024-09-02 ENCOUNTER — Emergency Department (HOSPITAL_COMMUNITY)
Admission: EM | Admit: 2024-09-02 | Discharge: 2024-09-02 | Disposition: A | Discharge status: Home / Self Care | Attending: Emergency Medicine | Admitting: Emergency Medicine

## 2024-09-02 DIAGNOSIS — R112 Nausea with vomiting, unspecified: Secondary | ICD-10-CM | POA: Insufficient documentation

## 2024-09-02 DIAGNOSIS — R197 Diarrhea, unspecified: Secondary | ICD-10-CM | POA: Diagnosis not present

## 2024-09-02 DIAGNOSIS — R109 Unspecified abdominal pain: Secondary | ICD-10-CM | POA: Diagnosis not present

## 2024-09-02 DIAGNOSIS — E876 Hypokalemia: Secondary | ICD-10-CM | POA: Insufficient documentation

## 2024-09-02 LAB — COMPREHENSIVE METABOLIC PANEL WITH GFR
ALT: 14 U/L (ref 0–44)
AST: 17 U/L (ref 15–41)
Albumin: 4.4 g/dL (ref 3.5–5.0)
Alkaline Phosphatase: 84 U/L (ref 38–126)
Anion gap: 16 — ABNORMAL HIGH (ref 5–15)
BUN: 7 mg/dL — ABNORMAL LOW (ref 8–23)
CO2: 22 mmol/L (ref 22–32)
Calcium: 9.8 mg/dL (ref 8.9–10.3)
Chloride: 100 mmol/L (ref 98–111)
Creatinine, Ser: 1.07 mg/dL — ABNORMAL HIGH (ref 0.44–1.00)
GFR, Estimated: 56 mL/min — ABNORMAL LOW (ref >60)
Glucose, Bld: 83 mg/dL (ref 70–99)
Potassium: 3.2 mmol/L — ABNORMAL LOW (ref 3.5–5.1)
Sodium: 138 mmol/L (ref 135–145)
Total Bilirubin: 0.6 mg/dL (ref 0.0–1.2)
Total Protein: 7.6 g/dL (ref 6.5–8.1)

## 2024-09-02 LAB — CBC WITH DIFFERENTIAL/PLATELET
Abs Immature Granulocytes: 0.01 K/uL (ref 0.00–0.07)
Basophils Absolute: 0 K/uL (ref 0.0–0.1)
Basophils Relative: 1 %
Eosinophils Absolute: 0.1 K/uL (ref 0.0–0.5)
Eosinophils Relative: 2 %
HCT: 43.1 % (ref 36.0–46.0)
Hemoglobin: 13.9 g/dL (ref 12.0–15.0)
Immature Granulocytes: 0 %
Lymphocytes Relative: 49 %
Lymphs Abs: 2.3 K/uL (ref 0.7–4.0)
MCH: 27 pg (ref 26.0–34.0)
MCHC: 32.3 g/dL (ref 30.0–36.0)
MCV: 83.9 fL (ref 80.0–100.0)
Monocytes Absolute: 0.3 K/uL (ref 0.1–1.0)
Monocytes Relative: 7 %
Neutro Abs: 1.9 K/uL (ref 1.7–7.7)
Neutrophils Relative %: 41 %
Platelets: 244 K/uL (ref 150–400)
RBC: 5.14 MIL/uL — ABNORMAL HIGH (ref 3.87–5.11)
RDW: 14 % (ref 11.5–15.5)
WBC: 4.7 K/uL (ref 4.0–10.5)
nRBC: 0 % (ref 0.0–0.2)

## 2024-09-02 LAB — I-STAT CHEM 8, ED
BUN: 7 mg/dL — ABNORMAL LOW (ref 8–23)
Calcium, Ion: 1.19 mmol/L (ref 1.15–1.40)
Chloride: 106 mmol/L (ref 98–111)
Creatinine, Ser: 1 mg/dL (ref 0.44–1.00)
Glucose, Bld: 86 mg/dL (ref 70–99)
HCT: 40 % (ref 36.0–46.0)
Hemoglobin: 13.6 g/dL (ref 12.0–15.0)
Potassium: 3.3 mmol/L — ABNORMAL LOW (ref 3.5–5.1)
Sodium: 142 mmol/L (ref 135–145)
TCO2: 24 mmol/L (ref 22–32)

## 2024-09-02 LAB — C DIFFICILE QUICK SCREEN W PCR REFLEX
C Diff antigen: NEGATIVE
C Diff interpretation: NOT DETECTED
C Diff toxin: NEGATIVE

## 2024-09-02 LAB — GASTROINTESTINAL PANEL BY PCR, STOOL (REPLACES STOOL CULTURE)

## 2024-09-02 LAB — URINALYSIS, ROUTINE W REFLEX MICROSCOPIC
Bilirubin Urine: NEGATIVE
Glucose, UA: NEGATIVE mg/dL
Ketones, ur: 5 mg/dL — AB
Leukocytes,Ua: NEGATIVE
Nitrite: NEGATIVE
Protein, ur: 30 mg/dL — AB
Specific Gravity, Urine: 1.027 (ref 1.005–1.030)
pH: 5 (ref 5.0–8.0)

## 2024-09-02 LAB — LIPASE, BLOOD: Lipase: 28 U/L (ref 11–51)

## 2024-09-02 MED ORDER — ONDANSETRON 4 MG PO TBDP: 4.0000 mg | ORAL_TABLET | Freq: Three times a day (TID) | ORAL | 0 refills | Status: AC | PRN

## 2024-09-02 MED ORDER — LOPERAMIDE HCL 2 MG PO CAPS: 2.0000 mg | ORAL_CAPSULE | Freq: Four times a day (QID) | ORAL | 0 refills | Status: AC | PRN

## 2024-09-02 MED ORDER — POTASSIUM CHLORIDE CRYS ER 10 MEQ PO TBCR: 30.0000 meq | EXTENDED_RELEASE_TABLET | Freq: Every day | ORAL | 0 refills | Status: AC

## 2024-09-02 MED ORDER — IOHEXOL 350 MG/ML SOLN
75.0000 mL | Freq: Once | INTRAVENOUS | Status: AC | PRN
  Administered 2024-09-02: 75 mL via INTRAVENOUS

## 2024-09-02 MED ORDER — DICYCLOMINE HCL 20 MG PO TABS
20.0000 mg | ORAL_TABLET | Freq: Two times a day (BID) | ORAL | 0 refills | Status: AC
Start: 2024-09-02 — End: ?

## 2024-09-02 MED ORDER — POTASSIUM CHLORIDE CRYS ER 20 MEQ PO TBCR
30.0000 meq | EXTENDED_RELEASE_TABLET | Freq: Once | ORAL | Status: AC
  Administered 2024-09-02: 30 meq via ORAL
  Filled 2024-09-02: qty 1

## 2024-09-02 NOTE — ED Triage Notes (Addendum)
 Pt came in via POV d/t continuous diarrhea for the past 2 weeks plus vomiting, has had a stool test done & nothing was showing & she is here for more tests & another eval. C/o body aches, denies blood in emesis or stool.

## 2024-09-02 NOTE — Discharge Instructions (Addendum)
 Today you were seen for abdominal pain with nausea, vomiting, and diarrhea.  You have been prescribed Imodium  for diarrhea, Zofran  for nausea and vomiting, and Bentyl for abdominal cramping.  Please follow-up with [facility] for further evaluation and workup.  Thank you for letting us  treat you today. After reviewing your labs and imaging, I feel you are safe to go home. Please follow up with your PCP in the next several days and provide them with your records from this visit. Return to the Emergency Room if pain becomes severe or symptoms worsen.

## 2024-09-02 NOTE — ED Provider Notes (Addendum)
 [facility] AT [facility] Provider Note   CSN: [id number] Arrival date & time: 09/02/24  1706     Patient presents with: Diarrhea and Emesis   Kylie Hudson is a 68 y.o. female presents today for continuous diarrhea for 2 weeks with associated vomiting.  Patient recently had a stool test done which was unremarkable.  Patient also reporting body aches.  Patient denies hematemesis, fever, chills, blood in stool, dysuria, hematuria, any other complaints at this time.    Diarrhea Associated symptoms: vomiting   Emesis Associated symptoms: diarrhea        Prior to Admission medications   Medication Sig Start Date End Date Taking? Authorizing Provider  dicyclomine (BENTYL) 20 MG tablet Take 1 tablet (20 mg total) by mouth 2 (two) times daily. 09/02/24  Yes Francis Ileana SAILOR, PA-C  loperamide  (IMODIUM ) 2 MG capsule Take 1 capsule (2 mg total) by mouth 4 (four) times daily as needed for diarrhea or loose stools. 09/02/24  Yes Francis Ileana SAILOR, PA-C  ondansetron  (ZOFRAN -ODT) 4 MG disintegrating tablet Take 1 tablet (4 mg total) by mouth every 8 (eight) hours as needed for nausea or vomiting. 09/02/24  Yes Areonna Bran N, PA-C  potassium chloride  (KLOR-CON  M) 10 MEQ tablet Take 3 tablets (30 mEq total) by mouth daily. 09/02/24  Yes Louden Houseworth N, PA-C  ibuprofen  (ADVIL ) 400 MG tablet Take 1 tablet (400 mg total) by mouth every 12 (twelve) hours as needed for up to 5 doses. 06/01/24   Cottie Donnice PARAS, MD  lidocaine  (LIDODERM ) 5 % Place 1 patch onto the skin daily. Remove & Discard patch within 12 hours or as directed by MD 03/14/24   Henderly, Britni A, PA-C  loperamide  (IMODIUM ) 2 MG capsule Take 1 capsule (2 mg total) by mouth 4 (four) times daily as needed for diarrhea or loose stools. 06/13/24   Zelaya, Oscar A, PA-C  methocarbamol  (ROBAXIN ) 500 MG tablet Take 1 tablet (500 mg total) by mouth 2 (two) times daily. 03/14/24   Henderly, Britni A, PA-C  metoprolol  succinate (TOPROL-XL) 100 MG 24 hr tablet  06/27/20   [provider]  mirtazapine (REMERON) 30 MG tablet Take 30 mg by mouth at bedtime. 01/12/19   [provider]  naproxen (EC NAPROSYN) 500 MG EC tablet SMARTSIG:1 Tablet(s) By Mouth Every 12 Hours PRN 03/21/21   [provider]  NIFEdipine (PROCARDIA-XL/NIFEDICAL-XL) 30 MG 24 hr tablet  07/07/20   [provider]  ondansetron  (ZOFRAN -ODT) 4 MG disintegrating tablet Take 1 tablet (4 mg total) by mouth every 8 (eight) hours as needed for nausea or vomiting. 11/25/23   Freddi Hamilton, MD  oxyCODONE  (ROXICODONE ) 5 MG immediate release tablet Take 1 tablet (5 mg total) by mouth at bedtime as needed for up to 3 doses for severe pain (pain score 7-10). 06/01/24   Cottie Donnice PARAS, MD  pantoprazole  (PROTONIX ) 40 MG tablet Take 1 tablet (40 mg total) by mouth daily. 07/29/24   Beather Delon Gibson, PA  predniSONE  (DELTASONE ) 20 MG tablet Take 1 tablet (20 mg total) by mouth daily with breakfast. 12/21/23   Rodriguez-Southworth, Kyra, PA-C  SUMAtriptan (IMITREX) 25 MG tablet Take 25 mg by mouth as needed. 01/28/23   [provider]    Allergies: Doxycycline and Nsaids    Review of Systems  Gastrointestinal:  Positive for diarrhea, nausea and vomiting.    Updated Vital Signs BP (!) 157/95 (BP Location: Right Arm)   Pulse 77  Temp 98.1 F (36.7 C)   Resp 14   Ht 5' 6 (1.676 m)   Wt 94.3 kg   SpO2 100%   BMI 33.57 kg/m   Physical Exam Vitals and nursing note reviewed.  Constitutional:      General: She is not in acute distress.    Appearance: Normal appearance. She is well-developed. She is not toxic-appearing.  HENT:     Head: Normocephalic and atraumatic.     Right Ear: External ear normal.     Left Ear: External ear normal.     Nose: Nose normal.     Mouth/Throat:     Mouth: Mucous membranes are moist.  Eyes:     Extraocular Movements: Extraocular movements intact.     Conjunctiva/sclera:  Conjunctivae normal.  Cardiovascular:     Rate and Rhythm: Normal rate and regular rhythm.     Pulses: Normal pulses.     Heart sounds: Normal heart sounds. No murmur heard. Pulmonary:     Effort: Pulmonary effort is normal. No respiratory distress.     Breath sounds: Normal breath sounds.  Abdominal:     General: There is no distension.     Palpations: Abdomen is soft.     Tenderness: There is no abdominal tenderness.  Musculoskeletal:        General: No swelling.     Cervical back: Neck supple.  Skin:    General: Skin is warm and dry.     Capillary Refill: Capillary refill takes less than 2 seconds.  Neurological:     General: No focal deficit present.     Mental Status: She is alert and oriented to person, place, and time.  Psychiatric:        Mood and Affect: Mood normal.     (all labs ordered are listed, but only abnormal results are displayed) Labs Reviewed  CBC WITH DIFFERENTIAL/PLATELET - Abnormal; Notable for the following components:      Result Value   RBC 5.14 (*)    All other components within normal limits  COMPREHENSIVE METABOLIC PANEL WITH GFR - Abnormal; Notable for the following components:   Potassium 3.2 (*)    BUN 7 (*)    Creatinine, Ser 1.07 (*)    GFR, Estimated 56 (*)    Anion gap 16 (*)    All other components within normal limits  URINALYSIS, ROUTINE W REFLEX MICROSCOPIC - Abnormal; Notable for the following components:   APPearance HAZY (*)    Hgb urine dipstick MODERATE (*)    Ketones, ur 5 (*)    Protein, ur 30 (*)    Bacteria, UA RARE (*)    All other components within normal limits  I-STAT CHEM 8, ED - Abnormal; Notable for the following components:   Potassium 3.3 (*)    BUN 7 (*)    All other components within normal limits  LIPASE, BLOOD    EKG: None  Radiology: CT ABDOMEN PELVIS W CONTRAST Result Date: 09/02/2024 CLINICAL DATA:  Abdominal pain. EXAM: CT ABDOMEN AND PELVIS WITH CONTRAST TECHNIQUE: Multidetector CT imaging  of the abdomen and pelvis was performed using the standard protocol following bolus administration of intravenous contrast. RADIATION DOSE REDUCTION: This exam was performed according to the departmental dose-optimization program which includes automated exposure control, adjustment of the mA and/or kV according to patient size and/or use of iterative reconstruction technique. CONTRAST:  75mL OMNIPAQUE  IOHEXOL  350 MG/ML SOLN COMPARISON:  CT abdomen pelvis dated 03/14/2024. FINDINGS: Lower chest: The visualized  lung bases are clear. No intra-abdominal free air or free fluid. Hepatobiliary: Subcentimeter hypodense lesion in the dome of the liver is too small to characterize. No biliary dilatation. Cholecystectomy. Pancreas: Unremarkable. No pancreatic ductal dilatation or surrounding inflammatory changes. Spleen: Ill-defined hypodense splenic lesions are not evaluated on this CT. Similar findings were seen on CTs dating back to 11/25/2023. Further evaluation with ultrasound or MRI on a nonemergent/outpatient basis recommended. Adrenals/Urinary Tract: The adrenal glands are unremarkable. Status post right nephrectomy. No suspicious lesion noted in the nephrectomy bed. The left kidney is unremarkable. The urinary bladder is collapsed. Stomach/Bowel: There is sigmoid diverticulosis. There is no bowel obstruction or active inflammation. The appendix is normal. Vascular/Lymphatic: The abdominal aorta and IVC unremarkable. No portal venous gas. There is no adenopathy. Reproductive: The uterus is anteverted. Partially calcified 2 cm fundal fibroid. No suspicious adnexal masses. Bilateral tubal ligation clips. Other: None Musculoskeletal: Degenerative changes of spine. No acute osseous pathology. IMPRESSION: 1. No acute intra-abdominal or pelvic pathology. 2. Sigmoid diverticulosis. No bowel obstruction. Normal appendix. 3. Status post right nephrectomy. 4. Ill-defined hypodense splenic lesions are not evaluated on this CT.  Further evaluation with ultrasound or MRI on a nonemergent/outpatient basis recommended. Electronically Signed   By: Vanetta Chou M.D.   On: 09/02/2024 18:33     Procedures   Medications Ordered in the ED  iohexol  (OMNIPAQUE ) 350 MG/ML injection 75 mL (75 mLs Intravenous Contrast Given 09/02/24 1814)  potassium chloride  (KLOR-CON  M) CR tablet 30 mEq (30 mEq Oral Given 09/02/24 2250)                                    Medical Decision Making Risk Prescription drug management.   This patient presents to the ED for concern of abdominal pain with vomiting and diarrhea differential diagnosis includes viral GI illness, diverticulitis, Crohn's disease, SBO, pancreatitis, appendicitis, choledocholithiasis, acute cholecystitis    Additional history obtained   Additional history obtained from Electronic Medical Record External records from outside source obtained and reviewed including family medicine gastroenterology notes   Lab Tests:  I Ordered, and personally interpreted labs.  The pertinent results include: Mild hypokalemia at 3.2, mildly elevated creatinine 1.07, mildly elevated anion gap at 16, CBC unremarkable, UA with moderate hemoglobin, 5 ketones, 30 proteins, rare bacteria   Imaging Studies ordered:  I ordered imaging studies including CT abdomen pelvis with contrast I independently visualized and interpreted imaging which showed no acute intra abdominal or pelvic pathology.  Ill-defined hypodense splenic lesions are not evaluated on this CT.  Further evaluation with ultrasound or MRI on nonemergent/outpatient basis is recommended. I agree with the radiologist interpretation   Medicines ordered and prescription drug management:  I ordered medication including potassium    I have reviewed the patients home medicines and have made adjustments as needed   Problem List / ED Course:  Considered for admission or further workup however patient's vital signs, physical  exam, labs, and imaging are reassuring.  Patient advised to follow-up with gastroenterology for further evaluation and workup.  Patient given outpatient course of potassium, Zofran , Imodium , and Bentyl for symptomatic management.  Patient given return precautions.  I feel patient is safe for discharge at this time.      Final diagnoses:  Abdominal pain, unspecified abdominal location  Nausea vomiting and diarrhea    ED Discharge Orders          Ordered  potassium chloride  (KLOR-CON  M) 10 MEQ tablet  Daily        09/02/24 2250    ondansetron  (ZOFRAN -ODT) 4 MG disintegrating tablet  Every 8 hours PRN        09/02/24 2250    dicyclomine (BENTYL) 20 MG tablet  2 times daily        09/02/24 2250    loperamide  (IMODIUM ) 2 MG capsule  4 times daily PRN        09/02/24 2250               Francis Ileana SAILOR, PA-C 09/02/24 2251    Francis Ileana SAILOR, PA-C 09/02/24 2252    Tegeler, Lonni PARAS, MD 09/02/24 2337

## 2024-09-02 NOTE — ED Provider Triage Note (Signed)
 Emergency Medicine Provider Triage Evaluation Note  Kylie Hudson , a 68 y.o. female  was evaluated in triage.  Pt complains of nausea, vomiting, diarrhea now for 2 weeks.  Had a recent negative stool test.  Here for additional testing.  States her body aches all over.  On exam she does have left CVA tenderness.  Review of Systems  Positive: As above Negative: As above  Physical Exam  BP (!) 178/99   Pulse 77   Temp 98.1 F (36.7 C)   Resp 18   Ht 5' 6 (1.676 m)   Wt 94.3 kg   SpO2 99%   BMI 33.57 kg/m  Gen:   Awake, no distress   Resp:  Normal effort  MSK:   Moves extremities without difficulty  Other:    Medical Decision Making  Medically screening exam initiated at 5:25 PM.  Appropriate orders placed.  Kylie Hudson was informed that the remainder of the evaluation will be completed by another provider, this initial triage assessment does not replace that evaluation, and the importance of remaining in the ED until their evaluation is complete.    Kylie Loge, PA-C 09/02/24 1725

## 2024-10-02 ENCOUNTER — Telehealth: Payer: Self-pay | Admitting: Physician Assistant

## 2024-10-02 NOTE — Telephone Encounter (Signed)
 Patient was able to reschedule her appointment to a sooner date with Dr. Charlanne for this coming Monday 11/24. She has had several ED visits over the past few months for abdominal pain/cramping, n/v, and diarrhea. Denies any bleeding. Most recent ED visit 09/02/24, patient had CT AP and labs/stool studies, and was just advised to follow up with our office. She is able to hold down fluids & doing okay with chicken broth. She's taking dicyclomine  20 mg BID & pantoprazole  40 mg daily. Suggested Ibgard OTC as well & advised she hydrate in the meantime until she can be seen on Monday. Discussed ED precautions. Pt verbalized all understanding.

## 2024-10-02 NOTE — Telephone Encounter (Signed)
 Inbound call from patient stating that she is needing a sooner appointment due to her symptoms getting worse. Patient is requesting a call back.Please advise.

## 2024-10-02 NOTE — Telephone Encounter (Signed)
 Dr Charlanne pt (see last office visit note per Delon )

## 2024-10-05 ENCOUNTER — Encounter: Payer: Self-pay | Admitting: Gastroenterology

## 2024-10-05 ENCOUNTER — Ambulatory Visit: Admitting: Gastroenterology

## 2024-10-05 VITALS — BP 130/80 | HR 81 | Ht 66.0 in | Wt 212.0 lb

## 2024-10-05 DIAGNOSIS — R112 Nausea with vomiting, unspecified: Secondary | ICD-10-CM

## 2024-10-05 DIAGNOSIS — R935 Abnormal findings on diagnostic imaging of other abdominal regions, including retroperitoneum: Secondary | ICD-10-CM | POA: Diagnosis not present

## 2024-10-05 DIAGNOSIS — K580 Irritable bowel syndrome with diarrhea: Secondary | ICD-10-CM | POA: Diagnosis not present

## 2024-10-05 MED ORDER — DICYCLOMINE HCL 20 MG PO TABS: 20.0000 mg | ORAL_TABLET | Freq: Three times a day (TID) | ORAL | 6 refills | Status: AC

## 2024-10-05 NOTE — Progress Notes (Signed)
 Chief Complaint: N/V  Referring Provider:  Clinic, [facility]      ASSESSMENT AND PLAN;   #1. Episodic N/V- ?etiology, likely cyclical vomiting syndrome.   #2. IBS-D. Neg colon jan 2025 @ [facility]   #3. Abn CT AP showing ill-defined splenic lesions.  Radiology recommends MRI.  Plan: -Protonix  40mg  po every day #90 -EGD, If neg GES. -Dicyclomine  20mg  TID #90 -MRI abdo with contast as suggested by radiology -Continue Zofran  4mg  po Q8hrs prn    Proceed with EGD. I have discussed the risks and benefits. The risks including rare risk of perforation, bleeding, missed UGI neoplasms, risks of anesthesia/sedation. Alternatives were given. Patient is aware and agrees to proceed. All the questions were answered. This will be scheduled in upcoming days. Consent forms were given for review.  HPI:    Logan Vegh is a 68 y.o. female  With IBS, OSA, Ca R kidney s/p resection, migraine, HTN, obesity, s/p cholecystectomy and other multiple problems as below Seen as an emergency working  History of Present Illness C/O episodic nausea, vomiting, and diarrhea.  She experiences chronic episodes of nausea, vomiting, and diarrhea, persisting for several years. These episodes occur sporadically, lasting up to two weeks, and have increased in frequency over the past six months. Eating often triggers either diarrhea or vomiting, with no clear pattern. She has lost significant weight, previously weighing 220 pounds and now fluctuating between 208 to 212 pounds. Her diet is limited, avoiding fried foods and certain items like mashed potatoes, which worsen her symptoms. Chicken noodle soup and ginger ale provide some relief.  Symptoms resolved after she goes to sleep.  No nocturnal symptoms  She has a history of multiple emergency department visits and has undergone various diagnostic tests. A CT scan on September 02, 2024, was performed, and the patient recalls being told there were no blockages, but  that some findings were present. A colonoscopy in January 2025 revealed multiple diverticula but was otherwise normal. She has also had a PET scan for the brain and a history of gallbladder removal.  Her medication regimen includes pantoprazole  for stomach issues and oxycodone  for back pain, which she uses sparingly. She has also used Zofran  for nausea, though its effectiveness is uncertain due to the sporadic nature of her symptoms.  Her past medical history includes right kidney removal due to cancer, high blood pressure, migraines, and sleep apnea. No alcohol use is reported, and she uses ginger ale to help settle her stomach. She experiences sleep disturbances, often waking after four hours and struggling to return to sleep.  She has a history of IBS-like symptoms since her mid-twenties, with chronic diarrhea being a consistent issue. No constipation is reported, and stress may exacerbate her symptoms.    Wt Readings from Last 3 Encounters:  10/05/24 212 lb (96.2 kg)  09/02/24 208 lb (94.3 kg)  06/13/24 220 lb 0.3 oz (99.8 kg)      Past GI workup: Colon 11/2023 @ [facility] Findings: Multiple small-mouthed diverticula were found in the sigmoid colon. Impression: - Diverticulosis in the sigmoid colon. Torturous colon. - No specimens collected. Recommendation: - Discharge patient to home (ambulatory). - The patient is not currently taking anticoagulant or antiplatelet agents. - Resume previous diet PRN. - Return to normal activities tomorrow. - No repeat colonoscopy    CT AP with contrast 09/02/2024 1. No acute intra-abdominal or pelvic pathology. 2. Sigmoid diverticulosis. No bowel obstruction. Normal appendix. 3. Status post right nephrectomy. 4. Ill-defined hypodense splenic  lesions are not evaluated on this CT. Further evaluation with ultrasound or MRI on a nonemergent/outpatient basis recommended.  PET scan 08/13/2024-head- was negative  SH- 2 boys Past Medical History:   Diagnosis Date   Gastritis    Kidney disease    Migraine     Past Surgical History:  Procedure Laterality Date   CESAREAN SECTION     CHOLECYSTECTOMY     NEPHRECTOMY     Pt can not remmeebr which kidney was removed.     Family History  Problem Relation Age of Onset   Inflammatory bowel disease Son     Social History   Tobacco Use   Smoking status: Never   Smokeless tobacco: Never  Vaping Use   Vaping status: Never Used  Substance Use Topics   Alcohol use: Never   Drug use: Never    Current Outpatient Medications  Medication Sig Dispense Refill   dicyclomine  (BENTYL ) 20 MG tablet Take 1 tablet (20 mg total) by mouth 2 (two) times daily. 20 tablet 0   ibuprofen  (ADVIL ) 400 MG tablet Take 1 tablet (400 mg total) by mouth every 12 (twelve) hours as needed for up to 5 doses. 5 tablet 0   lidocaine  (LIDODERM ) 5 % Place 1 patch onto the skin daily. Remove & Discard patch within 12 hours or as directed by MD 30 patch 0   loperamide  (IMODIUM ) 2 MG capsule Take 1 capsule (2 mg total) by mouth 4 (four) times daily as needed for diarrhea or loose stools. 12 capsule 0   loperamide  (IMODIUM ) 2 MG capsule Take 1 capsule (2 mg total) by mouth 4 (four) times daily as needed for diarrhea or loose stools. 12 capsule 0   methocarbamol  (ROBAXIN ) 500 MG tablet Take 1 tablet (500 mg total) by mouth 2 (two) times daily. 20 tablet 0   metoprolol succinate (TOPROL-XL) 100 MG 24 hr tablet      mirtazapine (REMERON) 30 MG tablet Take 30 mg by mouth at bedtime.     naproxen (EC NAPROSYN) 500 MG EC tablet SMARTSIG:1 Tablet(s) By Mouth Every 12 Hours PRN     NIFEdipine (PROCARDIA-XL/NIFEDICAL-XL) 30 MG 24 hr tablet      ondansetron  (ZOFRAN -ODT) 4 MG disintegrating tablet Take 1 tablet (4 mg total) by mouth every 8 (eight) hours as needed for nausea or vomiting. 20 tablet 0   pantoprazole  (PROTONIX ) 40 MG tablet Take 1 tablet (40 mg total) by mouth daily. 30 tablet 5   potassium chloride  (KLOR-CON  M)  10 MEQ tablet Take 3 tablets (30 mEq total) by mouth daily. 12 tablet 0   ondansetron  (ZOFRAN -ODT) 4 MG disintegrating tablet Take 1 tablet (4 mg total) by mouth every 8 (eight) hours as needed for nausea or vomiting. 10 tablet 0   oxyCODONE  (ROXICODONE ) 5 MG immediate release tablet Take 1 tablet (5 mg total) by mouth at bedtime as needed for up to 3 doses for severe pain (pain score 7-10). 3 tablet 0   predniSONE  (DELTASONE ) 20 MG tablet Take 1 tablet (20 mg total) by mouth daily with breakfast. 5 tablet 0   SUMAtriptan (IMITREX) 25 MG tablet Take 25 mg by mouth as needed.     No current facility-administered medications for this visit.    Allergies  Allergen Reactions   Doxycycline     Other reaction(s): Rash or Itch   Nsaids     Review of Systems:   anxiety or depression, does not sleep well     Physical Exam:  BP 130/80   Pulse 81   Ht 5' 6 (1.676 m)   Wt 212 lb (96.2 kg)   BMI 34.22 kg/m  Wt Readings from Last 3 Encounters:  10/05/24 212 lb (96.2 kg)  09/02/24 208 lb (94.3 kg)  06/13/24 220 lb 0.3 oz (99.8 kg)   Constitutional:  Well-developed, in no acute distress. Psychiatric: Normal mood and affect. Behavior is normal. HEENT: Pupils normal.  Conjunctivae are normal. No scleral icterus. Cardiovascular: Normal rate, regular rhythm. No edema Pulmonary/chest: Effort normal and breath sounds normal. No wheezing, rales or rhonchi. Abdominal: Soft, nondistended. Nontender. Bowel sounds active throughout. There are no masses palpable. No hepatomegaly. Rectal: Deferred Neurological: Alert and oriented to person place and time. Skin: Skin is warm and dry. No rashes noted.  Data Reviewed: I have personally reviewed following labs and imaging studies  CBC:    Latest Ref Rng & Units 09/02/2024    5:44 PM 09/02/2024    5:25 PM 09/01/2024    3:26 PM  CBC  WBC 4.0 - 10.5 K/uL  4.7  4.8   Hemoglobin 12.0 - 15.0 g/dL 86.3  86.0  85.7   Hematocrit 36.0 - 46.0 % 40.0   43.1  43.8   Platelets 150 - 400 K/uL  244  267     CMP:    Latest Ref Rng & Units 09/02/2024    5:44 PM 09/02/2024    5:25 PM 09/01/2024    3:26 PM  CMP  Glucose 70 - 99 mg/dL 86  83  67   BUN 8 - 23 mg/dL 7  7  7    Creatinine 0.44 - 1.00 mg/dL 8.99  8.92  8.99   Sodium 135 - 145 mmol/L 142  138  142   Potassium 3.5 - 5.1 mmol/L 3.3  3.2  3.6   Chloride 98 - 111 mmol/L 106  100  105   CO2 22 - 32 mmol/L  22  25   Calcium 8.9 - 10.3 mg/dL  9.8  9.7   Total Protein 6.5 - 8.1 g/dL  7.6  7.6   Total Bilirubin 0.0 - 1.2 mg/dL  0.6  0.8   Alkaline Phos 38 - 126 U/L  84  92   AST 15 - 41 U/L  17  23   ALT 0 - 44 U/L  14  16         Anselm Bring, MD 10/05/2024, 3:10 PM  Cc: Clinic, [facility]

## 2024-10-05 NOTE — Patient Instructions (Addendum)
 _______________________________________________________  If your blood pressure at your visit was 140/90 or greater, please contact your primary care physician to follow up on this.  _______________________________________________________  If you are age 69 or older, your body mass index should be between 23-30. Your Body mass index is 34.22 kg/m. If this is out of the aforementioned range listed, please consider follow up with your Primary Care Provider.  If you are age 73 or younger, your body mass index should be between 19-25. Your Body mass index is 34.22 kg/m. If this is out of the aformentioned range listed, please consider follow up with your Primary Care Provider.   ________________________________________________________  The [facility] providers would like to encourage you to use MYCHART to communicate with providers for non-urgent requests or questions.  Due to long hold times on the telephone, sending your provider a message by [facility] may be a faster and more efficient way to get a response.  Please allow 48 business hours for a response.  Please remember that this is for non-urgent requests.  _______________________________________________________  [facility] is using a team-based approach to care.  Your team is made up of your doctor and two to three APPS. Our APPS (Nurse Practitioners and Physician Assistants) work with your physician to ensure care continuity for you. They are fully qualified to address your health concerns and develop a treatment plan. They communicate directly with your gastroenterologist to care for you. Seeing the Advanced Practice Practitioners on your physician's team can help you by facilitating care more promptly, often allowing for earlier appointments, access to diagnostic testing, procedures, and other specialty referrals.   We have sent the following medications to your pharmacy for you to pick up at your convenience: Bentyl  20mg  3  times a day  Please take Protonix  daily. Let us  know if you need any additional refills  You have been scheduled for an MRI at [facility] on October 15 2024. Your appointment time is 330pm. Please arrive to admitting (at main entrance of the hospital) 30 minutes prior to your appointment time for registration purposes. Please make certain not to have anything to eat or drink 4 hours prior to your test. In addition, if you have any metal in your body, have a pacemaker or defibrillator, please be sure to let your ordering physician know. This test typically takes 45 minutes to 1 hour to complete. Should you need to reschedule, please call [phone] to do so.  You have been scheduled for an endoscopy. Please follow written instructions given to you at your visit today.  If you use inhalers (even only as needed), please bring them with you on the day of your procedure.  If you take any of the following medications, they will need to be adjusted prior to your procedure:   DO NOT TAKE 7 DAYS PRIOR TO TEST- Trulicity (dulaglutide) Ozempic, Wegovy (semaglutide) Mounjaro, Zepbound (tirzepatide) Bydureon Bcise (exanatide extended release)  DO NOT TAKE 1 DAY PRIOR TO YOUR TEST Rybelsus (semaglutide) Adlyxin (lixisenatide) Victoza (liraglutide) Byetta (exanatide) ___________________________________________________________________________   Thank you,  Dr. Lynnie Bring

## 2024-10-15 ENCOUNTER — Ambulatory Visit (HOSPITAL_COMMUNITY)
Admission: RE | Admit: 2024-10-15 | Discharge: 2024-10-15 | Disposition: A | Source: Ambulatory Visit | Attending: Gastroenterology | Admitting: Gastroenterology

## 2024-10-15 ENCOUNTER — Ambulatory Visit (HOSPITAL_COMMUNITY)

## 2024-10-15 DIAGNOSIS — R935 Abnormal findings on diagnostic imaging of other abdominal regions, including retroperitoneum: Secondary | ICD-10-CM | POA: Insufficient documentation

## 2024-10-15 MED ORDER — GADOBUTROL 1 MMOL/ML IV SOLN
10.0000 mL | Freq: Once | INTRAVENOUS | Status: AC | PRN
  Administered 2024-10-15: 10 mL via INTRAVENOUS

## 2024-10-17 ENCOUNTER — Ambulatory Visit: Payer: Self-pay | Admitting: Gastroenterology

## 2024-10-26 ENCOUNTER — Ambulatory Visit: Admitting: Physician Assistant

## 2024-10-28 ENCOUNTER — Ambulatory Visit: Admitting: Physician Assistant

## 2024-11-18 ENCOUNTER — Encounter: Admitting: Gastroenterology

## 2024-12-01 ENCOUNTER — Other Ambulatory Visit (HOSPITAL_COMMUNITY)
Admission: RE | Admit: 2024-12-01 | Discharge: 2024-12-01 | Disposition: A | Source: Ambulatory Visit | Attending: Obstetrics | Admitting: Obstetrics

## 2024-12-01 ENCOUNTER — Encounter: Payer: Self-pay | Admitting: Obstetrics and Gynecology

## 2024-12-01 ENCOUNTER — Ambulatory Visit (INDEPENDENT_AMBULATORY_CARE_PROVIDER_SITE_OTHER): Payer: Self-pay | Admitting: Obstetrics and Gynecology

## 2024-12-01 VITALS — BP 126/83 | HR 87 | Ht 66.0 in | Wt 211.0 lb

## 2024-12-01 DIAGNOSIS — Z1231 Encounter for screening mammogram for malignant neoplasm of breast: Secondary | ICD-10-CM

## 2024-12-01 DIAGNOSIS — Z124 Encounter for screening for malignant neoplasm of cervix: Secondary | ICD-10-CM

## 2024-12-01 DIAGNOSIS — N907 Vulvar cyst: Secondary | ICD-10-CM

## 2024-12-01 DIAGNOSIS — N9489 Other specified conditions associated with female genital organs and menstrual cycle: Secondary | ICD-10-CM

## 2024-12-01 NOTE — Progress Notes (Signed)
 "  GYNECOLOGY OFFICE NOTE  History:  69 y.o. [id number] here today for enlarged painful labial cyst that developed 2 weeks ago and has since drained and disappeared on its own.  Would like pap smear, reports menopause about 10 years ago, no bleeding since. Has been a long time she she had a pap but reports no history of abnormal. Would like mammogram as well.     Past Medical History:  Diagnosis Date   Anxiety    Arthritis    Cancer (HCC)    Depression    Gastritis    Hypertension    Kidney disease    Migraine    Preterm labor    Vaginal Pap smear, abnormal     Past Surgical History:  Procedure Laterality Date   CESAREAN SECTION     CHOLECYSTECTOMY     COLONOSCOPY  11/29/2023   [facility]. Diverticulosis in the colon   ESOPHAGOGASTRODUODENOSCOPY  11/29/2023   [facility]. Gastritis Biopsied. Multiple gastric polyps. Biopsied. Normal fourth portion of the duodenum. Biopsied   NEPHRECTOMY     Pt can not remmeebr which kidney was removed.    TUBAL LIGATION      Current Medications[1]  The following portions of the patient's history were reviewed and updated as appropriate: allergies, current medications, past family history, past medical history, past social history, past surgical history and problem list.   Review of Systems:  Pertinent items noted in HPI and remainder of comprehensive ROS otherwise negative.   Objective:  Physical Exam BP 126/83 (BP Location: Left Arm, Cuff Size: Large)   Pulse 87   Ht 5' 6 (1.676 m)   Wt 211 lb (95.7 kg)   BMI 34.06 kg/m  CONSTITUTIONAL: Well-developed, well-nourished female in no acute distress.  HENT:  Normocephalic, atraumatic. External right and left ear normal. Oropharynx is clear and moist EYES: Conjunctivae and EOM are normal. Pupils are equal, round, and reactive to light. No scleral icterus.  NECK: Normal range of motion, supple, no masses SKIN: Skin is warm and dry. No rash  noted. Not diaphoretic. No erythema. No pallor. NEUROLOGIC: Alert and oriented to person, place, and time. Normal reflexes, muscle tone coordination. No cranial nerve deficit noted. PSYCHIATRIC: Normal mood and affect. Normal behavior. Normal judgment and thought content. CARDIOVASCULAR: Normal heart rate noted RESPIRATORY: Effort normal, no problems with respiration noted ABDOMEN: Soft, no distention noted.   PELVIC: Normal appearing external genitalia; normal appearing vaginal mucosa and cervix.  No abnormal discharge noted.  Pap smear obtained.   Normal uterine size, left adnexal fixed mass, mildly tender, no other uterine or adnexal tenderness. MUSCULOSKELETAL: Normal range of motion. No edema noted.  Exam done with chaperone present.  Labs and Imaging No results found.  Assessment & Plan:  1. Cervical cancer screening (Primary) Reviewed screening guidelines, she would like to continue, this is reasonable given that it is many years since she had a pap  2. Encounter for screening mammogram for malignant neoplasm of breast Reviewed screening guidelines, she would like to cont screening  3. Adnexal mass US  to rule out fibroids/ovarian cyst  4. Labial cyst Healed spontaneously Reviewed if recurs, come back in for drainage/management   Routine preventative health maintenance measures emphasized. Please refer to After Visit Summary for other counseling recommendations.   Return for will contact patient with results for follow up, prn.   K. Yolanda Moats, MD, [facility] Attending Center for [facility])       [  1]  Current Outpatient Medications:    dicyclomine  (BENTYL ) 20 MG tablet, Take 1 tablet (20 mg total) by mouth 2 (two) times daily., Disp: 20 tablet, Rfl: 0   dicyclomine  (BENTYL ) 20 MG tablet, Take 1 tablet (20 mg total) by mouth 3 (three) times daily., Disp: 90 tablet, Rfl: 6   ibuprofen  (ADVIL ) 400 MG tablet, Take 1 tablet (400 mg total) by  mouth every 12 (twelve) hours as needed for up to 5 doses., Disp: 5 tablet, Rfl: 0   lidocaine  (LIDODERM ) 5 %, Place 1 patch onto the skin daily. Remove & Discard patch within 12 hours or as directed by MD, Disp: 30 patch, Rfl: 0   loperamide  (IMODIUM ) 2 MG capsule, Take 1 capsule (2 mg total) by mouth 4 (four) times daily as needed for diarrhea or loose stools., Disp: 12 capsule, Rfl: 0   loperamide  (IMODIUM ) 2 MG capsule, Take 1 capsule (2 mg total) by mouth 4 (four) times daily as needed for diarrhea or loose stools., Disp: 12 capsule, Rfl: 0   methocarbamol  (ROBAXIN ) 500 MG tablet, Take 1 tablet (500 mg total) by mouth 2 (two) times daily., Disp: 20 tablet, Rfl: 0   metoprolol succinate (TOPROL-XL) 100 MG 24 hr tablet, , Disp: , Rfl:    mirtazapine (REMERON) 30 MG tablet, Take 30 mg by mouth at bedtime., Disp: , Rfl:    naproxen (EC NAPROSYN) 500 MG EC tablet, SMARTSIG:1 Tablet(s) By Mouth Every 12 Hours PRN, Disp: , Rfl:    NIFEdipine (PROCARDIA-XL/NIFEDICAL-XL) 30 MG 24 hr tablet, , Disp: , Rfl:    ondansetron  (ZOFRAN -ODT) 4 MG disintegrating tablet, Take 1 tablet (4 mg total) by mouth every 8 (eight) hours as needed for nausea or vomiting., Disp: 10 tablet, Rfl: 0   ondansetron  (ZOFRAN -ODT) 4 MG disintegrating tablet, Take 1 tablet (4 mg total) by mouth every 8 (eight) hours as needed for nausea or vomiting., Disp: 20 tablet, Rfl: 0   oxyCODONE  (ROXICODONE ) 5 MG immediate release tablet, Take 1 tablet (5 mg total) by mouth at bedtime as needed for up to 3 doses for severe pain (pain score 7-10)., Disp: 3 tablet, Rfl: 0   pantoprazole  (PROTONIX ) 40 MG tablet, Take 1 tablet (40 mg total) by mouth daily., Disp: 30 tablet, Rfl: 5   potassium chloride  (KLOR-CON  M) 10 MEQ tablet, Take 3 tablets (30 mEq total) by mouth daily., Disp: 12 tablet, Rfl: 0   predniSONE  (DELTASONE ) 20 MG tablet, Take 1 tablet (20 mg total) by mouth daily with breakfast., Disp: 5 tablet, Rfl: 0   SUMAtriptan (IMITREX) 25 MG  tablet, Take 25 mg by mouth as needed., Disp: , Rfl:   "

## 2024-12-01 NOTE — Progress Notes (Signed)
 69 y.o. New GYN presents for Cyst on Labia.  Pt denies pain, swelling, puss.  PCP will schedule her Mammogram.

## 2024-12-02 LAB — CYTOLOGY - PAP
Comment: NEGATIVE
Diagnosis: NEGATIVE
High risk HPV: NEGATIVE

## 2024-12-03 ENCOUNTER — Ambulatory Visit: Payer: Self-pay | Admitting: Obstetrics and Gynecology

## 2024-12-08 ENCOUNTER — Ambulatory Visit (HOSPITAL_COMMUNITY)

## 2024-12-10 ENCOUNTER — Ambulatory Visit (HOSPITAL_COMMUNITY)
Admission: RE | Admit: 2024-12-10 | Discharge: 2024-12-10 | Disposition: A | Discharge status: Home / Self Care | Source: Ambulatory Visit | Attending: Obstetrics and Gynecology | Admitting: Obstetrics and Gynecology

## 2024-12-10 DIAGNOSIS — N9489 Other specified conditions associated with female genital organs and menstrual cycle: Secondary | ICD-10-CM | POA: Insufficient documentation

## 2024-12-15 ENCOUNTER — Ambulatory Visit

## 2024-12-15 ENCOUNTER — Ambulatory Visit
Admission: RE | Admit: 2024-12-15 | Discharge: 2024-12-15 | Disposition: A | Source: Ambulatory Visit | Attending: Obstetrics and Gynecology

## 2024-12-15 DIAGNOSIS — Z1231 Encounter for screening mammogram for malignant neoplasm of breast: Secondary | ICD-10-CM

## 2024-12-16 ENCOUNTER — Telehealth: Payer: Self-pay | Admitting: Gastroenterology

## 2024-12-16 ENCOUNTER — Encounter: Admitting: Gastroenterology

## 2024-12-16 NOTE — Telephone Encounter (Signed)
 Good Morning Dr. Charlanne,   I received a call from this patient requesting to reschedule her procedure due to her coming down with a stomach bug. Patient has been complaining of diarrhea. Patient was scheduled for today at 12:30 PM. Patient was rescheduled for March the 3rd. Please advise.    Thank you.

## 2024-12-18 NOTE — Telephone Encounter (Signed)
 I agree with March 3 RG

## 2025-01-12 ENCOUNTER — Encounter: Admitting: Gastroenterology
